# Patient Record
Sex: Male | Born: 1980 | Race: White | Hispanic: No | Marital: Married | Smoking: Former smoker
Health system: Southern US, Community
[De-identification: ages and names within clinical notes are randomized; demographics above are authoritative.]

## PROBLEM LIST (undated history)

## (undated) DIAGNOSIS — R042 Hemoptysis: Secondary | ICD-10-CM

## (undated) DIAGNOSIS — L409 Psoriasis, unspecified: Secondary | ICD-10-CM

## (undated) HISTORY — PX: NO PAST SURGERIES: SHX2092

## (undated) SURGERY — Surgical Case
Anesthesia: *Unknown

---

## 2011-07-26 ENCOUNTER — Emergency Department (HOSPITAL_COMMUNITY): Payer: BC Managed Care – PPO

## 2011-07-26 ENCOUNTER — Encounter (HOSPITAL_COMMUNITY): Admission: EM | Disposition: A | Discharge status: Home / Self Care | Payer: Self-pay | Source: Home / Self Care

## 2011-07-26 ENCOUNTER — Encounter: Payer: Self-pay | Admitting: *Deleted

## 2011-07-26 ENCOUNTER — Ambulatory Visit (HOSPITAL_COMMUNITY)
Admission: EM | Admit: 2011-07-26 | Discharge: 2011-07-26 | Disposition: A | Discharge status: Home / Self Care | Payer: BC Managed Care – PPO | Attending: Gastroenterology | Admitting: Gastroenterology

## 2011-07-26 DIAGNOSIS — K222 Esophageal obstruction: Secondary | ICD-10-CM | POA: Insufficient documentation

## 2011-07-26 DIAGNOSIS — Y92009 Unspecified place in unspecified non-institutional (private) residence as the place of occurrence of the external cause: Secondary | ICD-10-CM | POA: Insufficient documentation

## 2011-07-26 DIAGNOSIS — IMO0002 Reserved for concepts with insufficient information to code with codable children: Secondary | ICD-10-CM

## 2011-07-26 DIAGNOSIS — T18108A Unspecified foreign body in esophagus causing other injury, initial encounter: Secondary | ICD-10-CM

## 2011-07-26 HISTORY — PX: ESOPHAGOGASTRODUODENOSCOPY: SHX5428

## 2011-07-26 HISTORY — DX: Psoriasis, unspecified: L40.9

## 2011-07-26 LAB — CBC
HCT: 47.1 % (ref 39.0–52.0)
Hemoglobin: 16.5 g/dL (ref 13.0–17.0)
MCH: 32.3 pg (ref 26.0–34.0)
MCHC: 35 g/dL (ref 30.0–36.0)
MCV: 92.2 fL (ref 78.0–100.0)
RDW: 12 % (ref 11.5–15.5)

## 2011-07-26 LAB — BASIC METABOLIC PANEL
BUN: 12 mg/dL (ref 6–23)
CO2: 27 mEq/L (ref 19–32)
Calcium: 10 mg/dL (ref 8.4–10.5)
Chloride: 102 mEq/L (ref 96–112)
Creatinine, Ser: 1.02 mg/dL (ref 0.50–1.35)
GFR calc Af Amer: >90 mL/min (ref >90)

## 2011-07-26 LAB — DIFFERENTIAL
Basophils Relative: 1 % (ref 0–1)
Eosinophils Relative: 5 % (ref 0–5)
Monocytes Absolute: 0.5 10*3/uL (ref 0.1–1.0)
Monocytes Relative: 7 % (ref 3–12)
Neutro Abs: 4.2 10*3/uL (ref 1.7–7.7)

## 2011-07-26 SURGERY — EGD (ESOPHAGOGASTRODUODENOSCOPY)
Anesthesia: Moderate Sedation

## 2011-07-26 MED ORDER — GLUCAGON HCL (RDNA) 1 MG IJ SOLR
1.0000 mg | Freq: Once | INTRAMUSCULAR | Status: AC
  Administered 2011-07-26: 1 mg via INTRAVENOUS
  Filled 2011-07-26: qty 1

## 2011-07-26 MED ORDER — OMEPRAZOLE 20 MG PO CPDR: DELAYED_RELEASE_CAPSULE | ORAL | Status: DC

## 2011-07-26 NOTE — H&P (Signed)
  Primary Care Physician:  No primary provider on file. Primary Gastroenterologist:  Dr. Jannet Calip  Pre-Procedure History & Physical: HPI:  Steven Conrad is a 30 y.o. male here for removal of food impaction. Pt ate Venison and it got stuck 730 PM. Wife reports rare heartburn/regurgitation resultign in vomtiing. The patient denies abdominal or flank pain, anorexia, nausea or vomiting, change in bowel habits or black or bloody stools or weight loss. No ASA, BC, GOODY'S. RARE ETOH USE.   History reviewed. No pertinent past medical history.  History reviewed. No pertinent past surgical history.  Prior to Admission medications   Not on File    Allergies as of 07/26/2011  . (No Known Allergies)    FAMILY HX: NO COLON POLYPS/CANCER  History   Social History  . Marital Status: Married    Spouse Name: N/A    Number of Children: N/A  . Years of Education: N/A   Occupational History  . WORKS AT [employer]   Social History Main Topics  . Smoking status: Smoker, Current Status Unknown  . Smokeless tobacco: Not on file  . Alcohol Use: No  . Drug Use:   . Sexually Active:    Other Topics Concern  . Not on file   Social History Narrative  . No narrative on file    Review of Systems: See HPI, otherwise negative ROS   Physical Exam: BP 130/84  Pulse 66  Temp(Src) 97.7 F (36.5 C) (Oral)  Resp 18  Ht 5' 11" (1.803 m)  Wt 245 lb (111.131 kg)  BMI 34.17 kg/m2  SpO2 95% General:   Alert,  pleasant and cooperative in NAD Head:  Normocephalic and atraumatic. Neck:  Supple; no masses or thyromegaly. Lungs:  Clear throughout to auscultation.    Heart:  Regular rate and rhythm. Abdomen:  Soft, nontender and nondistended. Normal bowel sounds, without guarding, and without rebound.   Neurologic:  Alert and  oriented x4;  grossly normal neurologically.  Impression/Plan:   FOOD IMPACTION  PLAN: EGD TONIGHT +/- ESOPHAGEAL BIOPSIES OR DILATION  

## 2011-07-26 NOTE — ED Provider Notes (Signed)
History  Scribed for Benny Lennert, MD, the patient was seen in room APA14/APA14. This chart was scribed by Candelaria Stagers. The patient's care started at  9:04PM.     CSN: [id number] Arrival date & time: 07/26/2011  8:47 PM   First MD Initiated Contact with Patient 07/26/11 2054      Chief Complaint  Patient presents with  . Airway Obstruction     The history is provided by the patient.   Steven Conrad is a 30 y.o. male who presents to the Emergency Department after eating meat one hour ago and feeling like the meat is lodged in his throat.  He has tried drinking water and states that he vomits each time.  He has had similar instances before, but has never come to the ED following any instances.  He has had no SOB.  He has no PCP and currently smokes.      History reviewed. No pertinent past medical history.  History reviewed. No pertinent past surgical history.  No family history on file.  History  Substance Use Topics  . Smoking status: Smoker, Current Status Unknown  . Smokeless tobacco: Not on file  . Alcohol Use: No      Review of Systems  Constitutional: Negative for fever and chills.  HENT: Negative for rhinorrhea and neck pain.   Eyes: Negative for pain.  Respiratory: Negative for cough and shortness of breath.   Cardiovascular: Negative for chest pain.  Gastrointestinal: Positive for vomiting. Negative for nausea, abdominal pain and diarrhea.  Genitourinary: Negative for dysuria.  Musculoskeletal: Negative for back pain.  Skin: Negative for rash.  Neurological: Negative for dizziness and weakness.    Allergies  Review of patient's allergies indicates no known allergies.  Home Medications  No current outpatient prescriptions on file.  BP 152/99  Pulse 88  Temp(Src) 98.3 F (36.8 C) (Oral)  Resp 20  Ht 5\' 11"  (1.803 m)  Wt 245 lb (111.131 kg)  BMI 34.17 kg/m2  SpO2 98%  Physical Exam  Nursing note and vitals reviewed. Constitutional: He  is oriented to person, place, and time. He appears well-developed and well-nourished. No distress.  HENT:  Head: Normocephalic and atraumatic.  Eyes: EOM are normal. Pupils are equal, round, and reactive to light.  Neck: Neck supple. No tracheal deviation present.  Cardiovascular: Normal rate.   Pulmonary/Chest: Effort normal. No respiratory distress.  Abdominal: He exhibits no distension.  Musculoskeletal: Normal range of motion. He exhibits no edema.  Neurological: He is alert and oriented to person, place, and time. No sensory deficit.  Skin: Skin is warm and dry.  Psychiatric: He has a normal mood and affect. His behavior is normal.    ED Course  Procedures   DIAGNOSTIC STUDIES: Oxygen Saturation is 98% on room air, normal by my interpretation.    COORDINATION OF CARE:  9:06 Ordered: CBC ; Differential ; Basic metabolic panel ; glucagon (GLUCAGEN) injection 1 mg ; DG Chest Portable 1 View    Labs Reviewed  BASIC METABOLIC PANEL - Abnormal; Notable for the following:    Glucose, Bld 127 (*)    All other components within normal limits  CBC  DIFFERENTIAL   Dg Chest Portable 1 View  07/26/2011  *RADIOLOGY REPORT*  Clinical Data: Chest pain  PORTABLE CHEST - 1 VIEW  Comparison: None.  Findings: Heart size is upper normal.  Negative for heart failure or pneumonia.  Lungs are clear.  IMPRESSION: No active cardiopulmonary disease.  Original Report  Authenticated By: Camelia Phenes, M.D.     1. Foreign body in esophagus       MDM  Dr. Darrick Penna to see pt   The chart was scribed for me under my direct supervision.  I personally performed the history, physical, and medical decision making and all procedures in the evaluation of this patient.Benny Lennert, MD 07/26/11 2227

## 2011-07-26 NOTE — ED Notes (Signed)
Pt reports he feels food is stuck in back of throat.  Pt states he is unable to swallow food.  States able to swallow small amounts of liquid.  At present time, pt gag reflex is very strong and pt attempting to vomit food up, rather than swallow.

## 2011-07-26 NOTE — ED Notes (Signed)
Pt reports was eating about 45 min ago and feels like food is lodged in throat.  Reports unable to swallow.  No SOB noted at present time. Family reports voice is weaker than normal.

## 2011-07-26 NOTE — ED Notes (Signed)
Pt states that he does still feel like food is stuck in his throat, but reports ease of breathing.  No gagging or drooling noted at present time, and pt appears more calm.

## 2011-07-26 NOTE — ED Notes (Signed)
Pt transported to endoscopy 

## 2011-07-27 ENCOUNTER — Other Ambulatory Visit: Payer: Self-pay | Admitting: Gastroenterology

## 2011-07-27 ENCOUNTER — Telehealth: Payer: Self-pay | Admitting: Gastroenterology

## 2011-07-27 ENCOUNTER — Encounter (HOSPITAL_COMMUNITY): Payer: BC Managed Care – PPO

## 2011-07-27 ENCOUNTER — Encounter (HOSPITAL_COMMUNITY): Payer: Self-pay | Admitting: Pharmacy Technician

## 2011-07-27 ENCOUNTER — Encounter (HOSPITAL_COMMUNITY): Payer: Self-pay

## 2011-07-27 MED ORDER — MIDAZOLAM HCL 5 MG/5ML IJ SOLN
INTRAMUSCULAR | Status: DC | PRN
  Administered 2011-07-26 (×2): 1 mg via INTRAVENOUS
  Administered 2011-07-26 (×2): 2 mg via INTRAVENOUS

## 2011-07-27 MED ORDER — BUTAMBEN-TETRACAINE-BENZOCAINE 2-2-14 % EX AERO
INHALATION_SPRAY | CUTANEOUS | Status: DC | PRN
  Administered 2011-07-26: 2 via TOPICAL

## 2011-07-27 MED ORDER — MEPERIDINE HCL 100 MG/ML IJ SOLN
INTRAMUSCULAR | Status: DC | PRN
  Administered 2011-07-26 (×3): 25 mg via INTRAVENOUS
  Administered 2011-07-26: 50 mg via INTRAVENOUS

## 2011-07-27 NOTE — Telephone Encounter (Signed)
LMOM that Rx has been called in.  

## 2011-07-27 NOTE — OR Nursing (Signed)
Procedure done on 07/26/2011 at 2255. Late entry documentation into Epic due to not being able to get case booked.

## 2011-07-27 NOTE — Telephone Encounter (Signed)
CALLED NEXIUM 40 MG QAM TO [facility] [phone].

## 2011-07-27 NOTE — Telephone Encounter (Signed)
CALL PT TO PICK UP RX

## 2011-07-27 NOTE — Telephone Encounter (Signed)
Kim called from Endo- she was able to get on touch with the pt- they will be doing a phone interview since pt was just seen in the ED last evening

## 2011-07-27 NOTE — Telephone Encounter (Signed)
Message copied by Irish Elders on Wed Jul 27, 2011  9:24 AM ------      Message from: [place]      Created: Wed Jul 27, 2011  8:47 AM       PREOP TODAY 12/5 EGD/DIL TOMORROW 12/6  W/ PROPOFOL DUE TO AGITATION IN SPITE OF HIGH DOSES OF DEMEROL AND VERSED

## 2011-07-27 NOTE — Pre-Procedure Instructions (Signed)
Pharmacy technician has talked with pt about his medications and he reports that he only takes Omeprazole, no other medications. For some reason, this medication is not showing on the home medication list on my screen, but I did inform the pt to take this medication the morning of the procedure 07/28/11.

## 2011-07-27 NOTE — Telephone Encounter (Signed)
Called pt to give him Pre Op time ( today @ 11:15) no answer LMOM  With details and for him to call me back

## 2011-07-28 ENCOUNTER — Ambulatory Visit (HOSPITAL_COMMUNITY): Payer: BC Managed Care – PPO | Admitting: Anesthesiology

## 2011-07-28 ENCOUNTER — Ambulatory Visit (HOSPITAL_COMMUNITY)
Admission: RE | Admit: 2011-07-28 | Discharge: 2011-07-28 | Disposition: A | Discharge status: Home / Self Care | Payer: BC Managed Care – PPO | Source: Ambulatory Visit | Attending: Gastroenterology | Admitting: Gastroenterology

## 2011-07-28 ENCOUNTER — Other Ambulatory Visit: Payer: Self-pay | Admitting: Gastroenterology

## 2011-07-28 ENCOUNTER — Encounter (HOSPITAL_COMMUNITY): Payer: Self-pay | Admitting: Anesthesiology

## 2011-07-28 ENCOUNTER — Encounter (HOSPITAL_COMMUNITY)
Admission: RE | Disposition: A | Discharge status: Home / Self Care | Payer: Self-pay | Source: Ambulatory Visit | Attending: Gastroenterology

## 2011-07-28 ENCOUNTER — Encounter (HOSPITAL_COMMUNITY): Payer: Self-pay | Admitting: *Deleted

## 2011-07-28 DIAGNOSIS — K2990 Gastroduodenitis, unspecified, without bleeding: Secondary | ICD-10-CM

## 2011-07-28 DIAGNOSIS — K222 Esophageal obstruction: Secondary | ICD-10-CM

## 2011-07-28 DIAGNOSIS — K2940 Chronic atrophic gastritis without bleeding: Secondary | ICD-10-CM | POA: Insufficient documentation

## 2011-07-28 DIAGNOSIS — K2970 Gastritis, unspecified, without bleeding: Secondary | ICD-10-CM

## 2011-07-28 HISTORY — PX: SAVORY DILATION: SHX5439

## 2011-07-28 SURGERY — ESOPHAGOGASTRODUODENOSCOPY (EGD) WITH PROPOFOL
Anesthesia: Monitor Anesthesia Care

## 2011-07-28 MED ORDER — CLOBETASOL PROPIONATE 0.05 % EX CREA: TOPICAL_CREAM | Freq: Two times a day (BID) | CUTANEOUS | Status: AC

## 2011-07-28 MED ORDER — MINERAL OIL PO OIL
TOPICAL_OIL | ORAL | Status: AC
  Filled 2011-07-28: qty 30

## 2011-07-28 MED ORDER — BUTAMBEN-TETRACAINE-BENZOCAINE 2-2-14 % EX AERO
1.0000 | INHALATION_SPRAY | Freq: Once | CUTANEOUS | Status: AC
  Administered 2011-07-28: 1 via TOPICAL
  Filled 2011-07-28: qty 56

## 2011-07-28 MED ORDER — ESOMEPRAZOLE MAGNESIUM 40 MG PO CPDR: DELAYED_RELEASE_CAPSULE | ORAL | Status: AC

## 2011-07-28 MED ORDER — ONDANSETRON HCL 4 MG/2ML IJ SOLN
INTRAMUSCULAR | Status: AC
  Filled 2011-07-28: qty 2

## 2011-07-28 MED ORDER — GLYCOPYRROLATE 0.2 MG/ML IJ SOLN
INTRAMUSCULAR | Status: AC
  Filled 2011-07-28: qty 1

## 2011-07-28 MED ORDER — PROPOFOL 10 MG/ML IV EMUL
INTRAVENOUS | Status: DC | PRN
  Administered 2011-07-28: 75 ug/kg/min via INTRAVENOUS

## 2011-07-28 MED ORDER — ONDANSETRON HCL 4 MG/2ML IJ SOLN: 4.0000 mg | Freq: Once | INTRAMUSCULAR | Status: DC | PRN

## 2011-07-28 MED ORDER — MIDAZOLAM HCL 2 MG/2ML IJ SOLN
1.0000 mg | INTRAMUSCULAR | Status: DC | PRN
  Administered 2011-07-28: 2 mg via INTRAVENOUS

## 2011-07-28 MED ORDER — ONDANSETRON HCL 4 MG/2ML IJ SOLN
4.0000 mg | Freq: Once | INTRAMUSCULAR | Status: AC
  Administered 2011-07-28: 4 mg via INTRAVENOUS

## 2011-07-28 MED ORDER — LACTATED RINGERS IV SOLN
INTRAVENOUS | Status: DC
  Administered 2011-07-28: 10:00:00 via INTRAVENOUS

## 2011-07-28 MED ORDER — FENTANYL CITRATE 0.05 MG/ML IJ SOLN
INTRAMUSCULAR | Status: AC
  Filled 2011-07-28: qty 2

## 2011-07-28 MED ORDER — GLYCOPYRROLATE 0.2 MG/ML IJ SOLN
0.2000 mg | Freq: Once | INTRAMUSCULAR | Status: AC | PRN
  Administered 2011-07-28: 0.2 mg via INTRAVENOUS

## 2011-07-28 MED ORDER — STERILE WATER FOR IRRIGATION IR SOLN
Status: DC | PRN
  Administered 2011-07-28: 12:00:00

## 2011-07-28 MED ORDER — FENTANYL CITRATE 0.05 MG/ML IJ SOLN
INTRAMUSCULAR | Status: DC | PRN
  Administered 2011-07-28 (×2): 50 ug via INTRAVENOUS

## 2011-07-28 MED ORDER — FENTANYL CITRATE 0.05 MG/ML IJ SOLN: 25.0000 ug | INTRAMUSCULAR | Status: DC | PRN

## 2011-07-28 MED ORDER — MIDAZOLAM HCL 2 MG/2ML IJ SOLN
INTRAMUSCULAR | Status: AC
  Filled 2011-07-28: qty 2

## 2011-07-28 SURGICAL SUPPLY — 17 items
BLOCK BITE 60FR ADLT L/F BLUE (MISCELLANEOUS) ×2 IMPLANT
ELECT REM PT RETURN 9FT ADLT (ELECTROSURGICAL)
ELECTRODE REM PT RTRN 9FT ADLT (ELECTROSURGICAL) IMPLANT
FLOOR PAD 36X40 (MISCELLANEOUS) ×2
FORCEP RJ3 GP 1.8X160 W-NEEDLE (CUTTING FORCEPS) IMPLANT
FORCEPS BIOP RAD 4 LRG CAP 4 (CUTTING FORCEPS) ×2 IMPLANT
NEEDLE SCLEROTHERAPY 25GX240 (NEEDLE) IMPLANT
PAD FLOOR 36X40 (MISCELLANEOUS) ×1 IMPLANT
PROBE APC STR FIRE (PROBE) IMPLANT
PROBE INJECTION GOLD (MISCELLANEOUS)
PROBE INJECTION GOLD 7FR (MISCELLANEOUS) IMPLANT
SNARE ROTATE MED OVAL 20MM (MISCELLANEOUS) IMPLANT
SNARE SHORT THROW 13M SML OVAL (MISCELLANEOUS) IMPLANT
SYR 50ML LL SCALE MARK (SYRINGE) ×2 IMPLANT
TUBING ENDO SMARTCAP PENTAX (MISCELLANEOUS) ×2 IMPLANT
TUBING IRRIGATION ENDOGATOR (MISCELLANEOUS) ×2 IMPLANT
WATER STERILE IRR 1000ML POUR (IV SOLUTION) ×2 IMPLANT

## 2011-07-28 NOTE — Interval H&P Note (Signed)
History and Physical Interval Note:  07/28/2011 11:20 AM  Steven Conrad  has presented today for surgery, with the diagnosis of food impaction  The various methods of treatment have been discussed with the patient and family. After consideration of risks, benefits and other options for treatment, the patient has consented to  Procedure(s): ESOPHAGOGASTRODUODENOSCOPY (EGD) WITH PROPOFOL SAVORY DILATION MALONEY DILATION as a surgical intervention .  The patients' history has been reviewed, patient examined, no change in status, stable for surgery.  I have reviewed the patients' chart and labs.  Questions were answered to the patient's satisfaction.     [employer]

## 2011-07-28 NOTE — Telephone Encounter (Signed)
Addended by: [place] on: 07/28/2011 12:09 PM   Modules accepted: Orders

## 2011-07-28 NOTE — Telephone Encounter (Signed)
CALLED  IN CLOBETASOL .05% AS DIRECTED #1, [id number].

## 2011-07-28 NOTE — Op Note (Signed)
[facility] [address] [place], [place]  [address]  ENDOSCOPY PROCEDURE REPORT  PATIENT:  Steven Conrad, Steven Conrad  MR#:  [id number] BIRTHDATE:  November 07, 1980, 30 yrs. old  GENDER:  male  ENDOSCOPIST:  Jonette Eva, MD ASSISTANT: Referred by:  DR. Colon Branch  PROCEDURE DATE:  07/28/2011 PROCEDURE:  EGD with dilatation over guidewire, EGD with biopsy ASA CLASS: INDICATIONS:  FOOD IMPACTION 12/4 STRICTURE ON EGD 12/4 UNABLE TO DILATE DUE TO PT AGITATION AND ROSK OF ASPIRATION/STOMACH FULL OF FOOD.  MEDICATIONS:   MAC sedation, administered by CRNA TOPICAL ANESTHETIC:  Cetacaine Spray  DESCRIPTION OF PROCEDURE:   After the risks benefits and alternatives of the procedure were thoroughly explained, informed consent was obtained.  The  endoscope was introduced through the mouth and advanced to the second portion of the duodenum.  The instrument was slowly withdrawn as the mucosa was carefully examined.  Prior to withdrawal of the scope, the guidwire was placed.  The esophagus was dilated successfully.  The patient was recovered in endoscopy and discharged home in satisfactory condition. <<PROCEDUREIMAGES>> Mild gastritis was found & BIOPSIED VIA COLD FORCEPS.  A stricture was found in the distal esophagus NARRPWING THE LUMEN TO 12-13 MM. Dilation was then performed at the distal esophagus. NO BARRETT'S OR FELINE APPEARANCE TO THE ESOPHAGUS. NL DUODENUM.  1) Dilator:  Savary over guidewire  Size(s):  12.8 TO 16 MM Resistance:  moderate  Heme: Appearance:  COMPLICATIONS:  None  ENDOSCOPIC IMPRESSION: 1) Mild gastritis 2) Stricture in the distal esophagus  RECOMMENDATIONS: AWAIT BIOPSIES NEXIUM 30 MINUTES PRIOR TO BREAKFAST DAILY FOREVER OPV IN 3 MOS. NO NEED TO Bx ESOPHAGUS AT THIS TIME.  REPEAT EXAM:  No  ______________________________ Jonette Eva, MD  CC:  n. eSIGNED:   Tequisha Maahs at 07/28/2011 11:56 AM  Bonnee Quin, [phone]

## 2011-07-28 NOTE — Anesthesia Preprocedure Evaluation (Signed)
Anesthesia Evaluation  Patient identified by MRN, date of birth, ID band Patient awake    Reviewed: Allergy & Precautions, H&P , NPO status , Patient's Chart, lab work & pertinent test results  Airway Mallampati: I      Dental  (+) Teeth Intact   Pulmonary Current Smoker,    Pulmonary exam normal       Cardiovascular Regular Normal    Neuro/Psych    GI/Hepatic Hx food impaction   Endo/Other    Renal/GU      Musculoskeletal   Abdominal   Peds  Hematology   Anesthesia Other Findings   Reproductive/Obstetrics                           Anesthesia Physical Anesthesia Plan  ASA: II  Anesthesia Plan: MAC   Post-op Pain Management:    Induction:   Airway Management Planned: Nasal Cannula  Additional Equipment:   Intra-op Plan:   Post-operative Plan:   Informed Consent: I have reviewed the patients History and Physical, chart, labs and discussed the procedure including the risks, benefits and alternatives for the proposed anesthesia with the patient or authorized representative who has indicated his/her understanding and acceptance.     Plan Discussed with:   Anesthesia Plan Comments:         Anesthesia Quick Evaluation

## 2011-07-28 NOTE — Anesthesia Postprocedure Evaluation (Signed)
  Anesthesia Post-op Note  Patient: Steven Conrad  Procedure(s) Performed:  ESOPHAGOGASTRODUODENOSCOPY (EGD) WITH PROPOFOL - 10:30; SAVORY DILATION - Dilated with 12.8, 14, 15, and 16  Patient Location: PACU  Anesthesia Type: MAC  Level of Consciousness: awake, alert  and oriented  Airway and Oxygen Therapy: Patient Spontanous Breathing  Post-op Pain: none  Post-op Assessment: Post-op Vital signs reviewed, Patient's Cardiovascular Status Stable, Respiratory Function Stable and No signs of Nausea or vomiting  Post-op Vital Signs: Reviewed and stable  Complications: No apparent anesthesia complications

## 2011-07-28 NOTE — H&P (View-Only) (Signed)
  Primary Care Physician:  No primary provider on file. Primary Gastroenterologist:  Dr. Darrick Penna  Pre-Procedure History & Physical: HPI:  Steven Conrad is a 30 y.o. male here for removal of food impaction. Pt ate Venison and it got stuck 730 PM. Wife reports rare heartburn/regurgitation resultign in vomtiing. The patient denies abdominal or flank pain, anorexia, nausea or vomiting, change in bowel habits or black or bloody stools or weight loss. No ASA, BC, GOODY'S. RARE ETOH USE.   History reviewed. No pertinent past medical history.  History reviewed. No pertinent past surgical history.  Prior to Admission medications   Not on File    Allergies as of 07/26/2011  . (No Known Allergies)    FAMILY HX: NO COLON POLYPS/CANCER  History   Social History  . Marital Status: Married    Spouse Name: N/A    Number of Children: N/A  . Years of Education: N/A   Occupational History  . WORKS AT [employer]   Social History Main Topics  . Smoking status: Smoker, Current Status Unknown  . Smokeless tobacco: Not on file  . Alcohol Use: No  . Drug Use:   . Sexually Active:    Other Topics Concern  . Not on file   Social History Narrative  . No narrative on file    Review of Systems: See HPI, otherwise negative ROS   Physical Exam: BP 130/84  Pulse 66  Temp(Src) 97.7 F (36.5 C) (Oral)  Resp 18  Ht 5\' 11"  (1.803 m)  Wt 245 lb (111.131 kg)  BMI 34.17 kg/m2  SpO2 95% General:   Alert,  pleasant and cooperative in NAD Head:  Normocephalic and atraumatic. Neck:  Supple; no masses or thyromegaly. Lungs:  Clear throughout to auscultation.    Heart:  Regular rate and rhythm. Abdomen:  Soft, nontender and nondistended. Normal bowel sounds, without guarding, and without rebound.   Neurologic:  Alert and  oriented x4;  grossly normal neurologically.  Impression/Plan:   FOOD IMPACTION  PLAN: EGD TONIGHT +/- ESOPHAGEAL BIOPSIES OR DILATION

## 2011-07-28 NOTE — Transfer of Care (Signed)
Immediate Anesthesia Transfer of Care Note  Patient: Steven Conrad  Procedure(s) Performed:  ESOPHAGOGASTRODUODENOSCOPY (EGD) WITH PROPOFOL - 10:30; SAVORY DILATION - Dilated with 12.8, 14, 15, and 16  Patient Location: PACU  Anesthesia Type: MAC  Level of Consciousness: awake, alert  and oriented  Airway & Oxygen Therapy: Patient Spontanous Breathing  Post-op Assessment: Report given to PACU RN  Post vital signs: Reviewed and stable  Complications: No apparent anesthesia complications

## 2011-07-29 ENCOUNTER — Telehealth: Payer: Self-pay | Admitting: Gastroenterology

## 2011-07-29 NOTE — Telephone Encounter (Signed)
Please call pt. His stomach Bx shows mild gastritis. Continue NEXIUM 30 minutes prior to YOUR FIRST meal DAILY. OPV MAR 2013.

## 2011-08-01 NOTE — Telephone Encounter (Signed)
LMOM to call.

## 2011-08-02 ENCOUNTER — Encounter (HOSPITAL_COMMUNITY): Payer: Self-pay | Admitting: Gastroenterology

## 2011-08-02 NOTE — Telephone Encounter (Signed)
Pt informed

## 2011-08-02 NOTE — Telephone Encounter (Signed)
No PCP on file 

## 2011-08-03 NOTE — Telephone Encounter (Signed)
Reminder in epic to follow up in March 2013

## 2011-08-12 ENCOUNTER — Encounter (HOSPITAL_COMMUNITY): Payer: Self-pay | Admitting: Gastroenterology

## 2011-11-14 ENCOUNTER — Encounter: Payer: Self-pay | Admitting: Gastroenterology

## 2017-09-25 DIAGNOSIS — R042 Hemoptysis: Secondary | ICD-10-CM | POA: Diagnosis not present

## 2017-09-25 DIAGNOSIS — Z79899 Other long term (current) drug therapy: Secondary | ICD-10-CM | POA: Diagnosis not present

## 2017-09-25 DIAGNOSIS — R05 Cough: Secondary | ICD-10-CM | POA: Diagnosis not present

## 2017-09-25 DIAGNOSIS — F17210 Nicotine dependence, cigarettes, uncomplicated: Secondary | ICD-10-CM | POA: Diagnosis not present

## 2017-09-25 DIAGNOSIS — R911 Solitary pulmonary nodule: Secondary | ICD-10-CM | POA: Diagnosis not present

## 2017-09-26 ENCOUNTER — Other Ambulatory Visit: Payer: Self-pay

## 2017-09-26 ENCOUNTER — Emergency Department (HOSPITAL_COMMUNITY): Payer: BLUE CROSS/BLUE SHIELD

## 2017-09-26 ENCOUNTER — Encounter (HOSPITAL_COMMUNITY): Payer: Self-pay | Admitting: Emergency Medicine

## 2017-09-26 ENCOUNTER — Emergency Department (HOSPITAL_COMMUNITY)
Admission: EM | Admit: 2017-09-26 | Discharge: 2017-09-26 | Disposition: A | Discharge status: Home / Self Care | Payer: BLUE CROSS/BLUE SHIELD | Attending: Emergency Medicine | Admitting: Emergency Medicine

## 2017-09-26 ENCOUNTER — Telehealth: Payer: Self-pay

## 2017-09-26 ENCOUNTER — Telehealth: Payer: Self-pay | Admitting: Internal Medicine

## 2017-09-26 DIAGNOSIS — R05 Cough: Secondary | ICD-10-CM | POA: Diagnosis not present

## 2017-09-26 DIAGNOSIS — R042 Hemoptysis: Secondary | ICD-10-CM

## 2017-09-26 DIAGNOSIS — R911 Solitary pulmonary nodule: Secondary | ICD-10-CM

## 2017-09-26 LAB — COMPREHENSIVE METABOLIC PANEL
ALT: 36 U/L (ref 17–63)
ANION GAP: 13 (ref 5–15)
AST: 27 U/L (ref 15–41)
Albumin: 4.5 g/dL (ref 3.5–5.0)
Alkaline Phosphatase: 72 U/L (ref 38–126)
BUN: 13 mg/dL (ref 6–20)
CHLORIDE: 103 mmol/L (ref 101–111)
CO2: 22 mmol/L (ref 22–32)
Calcium: 9.8 mg/dL (ref 8.9–10.3)
Creatinine, Ser: 0.98 mg/dL (ref 0.61–1.24)
GFR calc non Af Amer: >60 mL/min (ref >60)
GLUCOSE: 104 mg/dL — AB (ref 65–99)
Potassium: 4.1 mmol/L (ref 3.5–5.1)
SODIUM: 138 mmol/L (ref 135–145)
Total Bilirubin: 0.6 mg/dL (ref 0.3–1.2)
Total Protein: 7.2 g/dL (ref 6.5–8.1)

## 2017-09-26 LAB — CBC WITH DIFFERENTIAL/PLATELET
Basophils Absolute: 0 10*3/uL (ref 0.0–0.1)
Basophils Relative: 1 %
EOS ABS: 0.2 10*3/uL (ref 0.0–0.7)
EOS PCT: 3 %
HCT: 43.5 % (ref 39.0–52.0)
HEMOGLOBIN: 15 g/dL (ref 13.0–17.0)
LYMPHS ABS: 2.3 10*3/uL (ref 0.7–4.0)
Lymphocytes Relative: 40 %
MCH: 32.8 pg (ref 26.0–34.0)
MCHC: 34.5 g/dL (ref 30.0–36.0)
MCV: 95.2 fL (ref 78.0–100.0)
Monocytes Absolute: 0.7 10*3/uL (ref 0.1–1.0)
Monocytes Relative: 12 %
NEUTROS PCT: 44 %
Neutro Abs: 2.6 10*3/uL (ref 1.7–7.7)
PLATELETS: 177 10*3/uL (ref 150–400)
RBC: 4.57 MIL/uL (ref 4.22–5.81)
RDW: 12.6 % (ref 11.5–15.5)
WBC: 5.8 10*3/uL (ref 4.0–10.5)

## 2017-09-26 NOTE — Telephone Encounter (Signed)
MW agreed to see pt next Friday in 15 minute slot.  Called pt to schedule consult.  Nothing further needed.

## 2017-09-26 NOTE — Telephone Encounter (Signed)
Steven Conrad has been scheduled with Dr. Nicholos Johnsamachandran [place]30 [place]. Steven Conrad is aware of new appt. Nothing else needed at time of call.

## 2017-09-26 NOTE — Progress Notes (Signed)
Imaging personally reviewed, CT chest 09/26/17, there is a 2.5 cm nodule in the right middle lobe, no significant lymphadenopathy, no other abnormalities are noted.

## 2017-09-26 NOTE — ED Provider Notes (Signed)
[facility] EMERGENCY DEPARTMENT Provider Note   CSN: [id number] Arrival date & time: 09/25/17  2354     History   Chief Complaint Chief Complaint  Patient presents with  . Hemoptysis    HPI Steven Conrad is a 37 y.o. male with history of psoriasis and active cigarette smoker who presents following a few episodes of hemoptysis that occurred last night.  Patient reports that he felt like he had some phlegm in the back of his throat and coughed up 3-4 times large amounts of blood with clots.  The pictures the patient showed me showed about dime to quarter size clots with significantly blood-tinged sputum.  Patient reports he usually has a few cigarettes a day now, however used to smoke more.  He reports he occasionally has a spot of blood when he coughs over the past year, however but nothing like this.  He denies any chest pain or shortness of breath.  He denies any coughing, fever, nasal congestion, or other signs of respiratory infection.  He states he was feeling fine prior to the episodes of hemoptysis.  He did not do anything at home prior to arrival.  He denies any fever, night sweats, weight changes.  HPI  Past Medical History:  Diagnosis Date  . Psoriasis     There are no active problems to display for this patient.   Past Surgical History:  Procedure Laterality Date  . ESOPHAGOGASTRODUODENOSCOPY  07/26/2011   Procedure: ESOPHAGOGASTRODUODENOSCOPY (EGD);  Surgeon: Arlyce Harman, MD;  Location: [facility] SUITE;  Service: Endoscopy;  Laterality: N/A;  . NO PAST SURGERIES    . SAVORY DILATION  07/28/2011   Procedure: SAVORY DILATION;  Surgeon: Arlyce Harman, MD;  Location: [facility] ORS;  Service: Endoscopy;  Laterality: N/A;  Dilated with 12.8, 14, 15, and 16       Home Medications    Prior to Admission medications   Medication Sig Start Date End Date Taking? Authorizing Provider  esomeprazole (NEXIUM) 40 MG capsule 1 po 30 minutes prior to breakfast  07/28/11   West Bali, MD    Family History Family History  Problem Relation Age of Onset  . Anesthesia problems Neg Hx   . Hypotension Neg Hx   . Malignant hyperthermia Neg Hx   . Pseudochol deficiency Neg Hx     Social History Social History   Tobacco Use  . Smoking status: Current Every Day Smoker    Packs/day: 0.50    Years: 18.00    Pack years: 9.00    Types: Cigarettes  . Smokeless tobacco: Never Used  Substance Use Topics  . Alcohol use: Yes    Comment: 1/5 liquor on weekends  . Drug use: No     Allergies   Patient has no known allergies.   Review of Systems Review of Systems  Constitutional: Negative for chills and fever.  HENT: Negative for facial swelling and sore throat.   Respiratory: Positive for cough (hemoptysis). Negative for shortness of breath.   Cardiovascular: Negative for chest pain.  Gastrointestinal: Negative for abdominal pain, nausea and vomiting.  Genitourinary: Negative for dysuria.  Musculoskeletal: Negative for back pain.  Skin: Negative for rash and wound.  Neurological: Negative for headaches.  Psychiatric/Behavioral: The patient is not nervous/anxious.      Physical Exam Updated Vital Signs BP 118/75 (BP Location: Right Arm)   Pulse (!) 51   Temp 97.8 F (36.6 C) (Oral)   Resp 16   Ht  5\' 10"  (1.778 m)   Wt 99.8 kg (220 lb)   SpO2 99%   BMI 31.57 kg/m   Physical Exam  Constitutional: He appears well-developed and well-nourished. No distress.  HENT:  Head: Normocephalic and atraumatic.  Mouth/Throat: Oropharynx is clear and moist. No oropharyngeal exudate.  Eyes: Conjunctivae are normal. Pupils are equal, round, and reactive to light. Right eye exhibits no discharge. Left eye exhibits no discharge. No scleral icterus.  Neck: Normal range of motion. Neck supple. No thyromegaly present.  Cardiovascular: Normal rate, regular rhythm, normal heart sounds and intact distal pulses. Exam reveals no gallop and no friction  rub.  No murmur heard. Pulmonary/Chest: Effort normal and breath sounds normal. No stridor. No respiratory distress. He has no wheezes. He has no rales.  Abdominal: Soft. Bowel sounds are normal. He exhibits no distension. There is no tenderness. There is no rebound and no guarding.  Musculoskeletal: He exhibits no edema.  Lymphadenopathy:    He has no cervical adenopathy.  Neurological: He is alert. Coordination normal.  Skin: Skin is warm and dry. No rash noted. He is not diaphoretic. No pallor.  Psychiatric: He has a normal mood and affect.  Nursing note and vitals reviewed.    ED Treatments / Results  Labs (all labs ordered are listed, but only abnormal results are displayed) Labs Reviewed  COMPREHENSIVE METABOLIC PANEL - Abnormal; Notable for the following components:      Result Value   Glucose, Bld 104 (*)    All other components within normal limits  CBC WITH DIFFERENTIAL/PLATELET    EKG  EKG Interpretation None       Radiology Dg Chest 2 View  Result Date: 09/26/2017 CLINICAL DATA:  Productive cough EXAM: CHEST  2 VIEW COMPARISON:  07/26/2011 FINDINGS: Small focal opacity in the right mid lung. No pleural effusion. Normal heart size. No pneumothorax. IMPRESSION: Small focal opacity in the right mid lung, could represent a small focus of inflammation or infiltrate. Electronically Signed   By: Jasmine Pang M.D.   On: 09/26/2017 00:39   Ct Chest Wo Contrast  Result Date: 09/26/2017 CLINICAL DATA:  Hemoptysis.  Abnormal chest x-ray. EXAM: CT CHEST WITHOUT CONTRAST TECHNIQUE: Multidetector CT imaging of the chest was performed following the standard protocol without IV contrast. COMPARISON:  Two-view chest x-ray from the same day. FINDINGS: Cardiovascular: Heart size is normal. Aorta and great vessels are within normal limits. Pulmonary artery size is unremarkable. No significant pericardial effusion is present. Mediastinum/Nodes: No significant mediastinal, hilar, or  axillary adenopathy is present. Lungs/Pleura: The pleural based spiculated nodular density is present along the minor fissure in the right middle lobe. The lesion measures 2.5 x 2.3 x 1.8 cm. There is focal cavitation anteriorly. No other focal nodule or airspace disease is present. There is no pleural effusion or pneumothorax. Upper Abdomen: Within normal limits. Musculoskeletal: Vertebral body heights and alignment are normal. IMPRESSION: 1. Spiculated pulmonary nodule in the right middle lobe measures 2.5 x 2.3 x 1.8 cm. Although the patient is only 37 years old, with a significant smoking history, this is most concerning for a primary lung neoplasm. Cavitary infection is considered less likely. Recommend PET scan for further evaluation of malignancy. These results were called by telephone at the time of interpretation on 09/26/2017 at 7:54 am to [facility] Ivelise Castillo, PA, who verbally acknowledged these results. Electronically Signed   By: Marin Roberts M.D.   On: 09/26/2017 07:57    Procedures Procedures (including critical care time)  Medications Ordered in ED Medications - No data to display   Initial Impression / Assessment and Plan / ED Course  I have reviewed the triage vital signs and the nursing notes.  Pertinent labs & imaging results that were available during my care of the patient were reviewed by me and considered in my medical decision making (see chart for details).     Patient with spiculated pulmonary nodule in the right middle lobe found on CT today in the setting of hemoptysis.  Radiologist states considering significant smoking history, most concerning for primary lung neoplasm and recommend PET scan for further evaluation.  Lung nodule measures 2.5 x 2.3 x 1.8 cm.  Chest x-ray showed small focal opacity in the right mid lung, could represent a small focus of inflammation or infiltrate.  Considering no signs of infection, large amount of hemoptysis, and smoking history, CT was  ordered.  Patient advised of these findings and that he needs to follow-up with pulmonary for further evaluation, including PET scan.  He understands the seriousness and urgency of this finding.  Smoking cessation discussed several times.  He and his wife understand and agree with plan.  Patient vitals stable and discharged in satisfactory condition. I discussed patient case with Dr. Preston FleetingGlick who guided the patient's management and agrees with plan.   Final Clinical Impressions(s) / ED Diagnoses   Final diagnoses:  Hemoptysis  Pulmonary nodule, right    ED Discharge Orders        Ordered    Ambulatory referral to Pulmonology    Comments:  Spiculated lung nodule found on chest CT needing further workup   09/26/17 0800       Emi HolesLaw, Danel Requena M, PA-C 09/26/17 [id number]    Dione BoozeGlick, David, MD 10/02/17 2250

## 2017-09-26 NOTE — Discharge Instructions (Signed)
We found a pulmonary nodule on your lung CT today very concerning for lung cancer.  It is important that you follow-up with the pulmonary doctor as soon as possible for further evaluation and treatment. A PET scan was recommended by the radiologist that read your CT scan, which will be arranged by the lung doctor. I recommend that you quit smoking as soon as possible.  You can call the number included on this paperwork to help with smoking cessation.  Please return to the emergency department if you develop any new or worsening symptoms.  It was a pleasure taking care of you today.

## 2017-09-26 NOTE — ED Triage Notes (Signed)
Pt st's he felt like he had phlegm in his throat tonight and when he coughed it up it was blood.  St's then it happened 2-3 more times.  Pt denies any other symptoms and denies recently being sick.

## 2017-09-27 ENCOUNTER — Encounter: Payer: Self-pay | Admitting: Internal Medicine

## 2017-09-27 ENCOUNTER — Ambulatory Visit: Payer: BLUE CROSS/BLUE SHIELD

## 2017-09-27 ENCOUNTER — Encounter
Admission: RE | Admit: 2017-09-27 | Discharge: 2017-09-27 | Disposition: A | Discharge status: Home / Self Care | Payer: PRIVATE HEALTH INSURANCE | Source: Ambulatory Visit | Attending: Internal Medicine | Admitting: Internal Medicine

## 2017-09-27 ENCOUNTER — Ambulatory Visit (INDEPENDENT_AMBULATORY_CARE_PROVIDER_SITE_OTHER): Payer: BLUE CROSS/BLUE SHIELD | Admitting: Internal Medicine

## 2017-09-27 ENCOUNTER — Telehealth: Payer: Self-pay | Admitting: Internal Medicine

## 2017-09-27 ENCOUNTER — Other Ambulatory Visit: Payer: Self-pay

## 2017-09-27 VITALS — BP 156/92 | HR 71 | Resp 16 | Ht 68.0 in | Wt 232.0 lb

## 2017-09-27 DIAGNOSIS — R918 Other nonspecific abnormal finding of lung field: Secondary | ICD-10-CM

## 2017-09-27 HISTORY — DX: Hemoptysis: R04.2

## 2017-09-27 NOTE — H&P (View-Only) (Signed)
Name: Steven Conrad MRN: [id number] DOB: 10-20-1980     CONSULTATION DATE: 2.6.19 REFERRING MD : [facility]  CHIEF COMPLAINT:  hemoptysis     HISTORY OF PRESENT ILLNESS:    37 yo pleasant white male seen today for acute hemoptysis Patient was at home when he developed acute hemoptysis 2 days ago It has stooped since then, no previous history of hemoptysis Denies fevers, chills, NVD. Patient denies CP, SOB,DOE He states that he had pneumonia approx 6 months had CXR in [place]-imaging not available at this time  I reviewed CT chest with patient/wife CT chest images shows to patient  Patient has been smoking since age 62   PAST MEDICAL HISTORY :   has a past medical history of Psoriasis.  has a past surgical history that includes No past surgeries; Savory dilation (07/28/2011); and Esophagogastroduodenoscopy (07/26/2011). Prior to Admission medications   Medication Sig Start Date End Date Taking? Authorizing Provider  esomeprazole (NEXIUM) 40 MG capsule 1 po 30 minutes prior to breakfast Patient not taking: Reported on 09/27/2017 07/28/11   West Bali, MD   No Known Allergies  FAMILY HISTORY:  family history includes Sleep apnea in his mother; Thyroid cancer in his mother; Thyroid disease in his mother. SOCIAL HISTORY:  reports that he has been smoking cigarettes.  He has a 9.00 pack-year smoking history. he has never used smokeless tobacco. He reports that he drinks alcohol. He reports that he does not use drugs.  REVIEW OF SYSTEMS:   Constitutional: Negative for fever, chills, weight loss, malaise/fatigue and diaphoresis.  HENT: Negative for hearing loss, ear pain, nosebleeds, congestion, sore throat, neck pain, tinnitus and ear discharge.   Eyes: Negative for blurred vision, double vision, photophobia, pain, discharge and redness.  Respiratory: +cough, +hemoptysis,- sputum production, -shortness of breath, -wheezing and- stridor.   Cardiovascular: Negative for chest  pain, palpitations, orthopnea, claudication, leg swelling and PND.  Gastrointestinal: Negative for heartburn, nausea, vomiting, abdominal pain, diarrhea, constipation, blood in stool and melena.  Genitourinary: Negative for dysuria, urgency, frequency, hematuria and flank pain.  Musculoskeletal: Negative for myalgias, back pain, joint pain and falls.  Skin: Negative for itching and rash.  Neurological: Negative for dizziness, tingling, tremors, sensory change, speech change, focal weakness, seizures, loss of consciousness, weakness and headaches.  Endo/Heme/Allergies: Negative for environmental allergies and polydipsia. Does not bruise/bleed easily.  ALL OTHER ROS ARE NEGATIVE   BP (!) 156/92 (BP Location: Left Arm, Cuff Size: Large)   Pulse 71   Resp 16   Ht 5\' 8"  (1.727 m)   Wt 232 lb (105.2 kg)   SpO2 99%   BMI 35.28 kg/m   Physical Examination:   GENERAL:NAD, no fevers, chills, no weakness no fatigue HEAD: Normocephalic, atraumatic.  EYES: Pupils equal, round, reactive to light. Extraocular muscles intact. No scleral icterus.  MOUTH: Moist mucosal membrane.   EAR, NOSE, THROAT: Clear without exudates. No external lesions.  NECK: Supple. No thyromegaly. No nodules. No JVD.  PULMONARY:CTA B/L no wheezes, no crackles, no rhonchi CARDIOVASCULAR: S1 and S2. Regular rate and rhythm. No murmurs, rubs, or gallops. No edema.  GASTROINTESTINAL: Soft, nontender, nondistended. No masses. Positive bowel sounds.  MUSCULOSKELETAL: No swelling, clubbing, or edema. Range of motion full in all extremities.  NEUROLOGIC: Cranial nerves II through XII are intact. No gross focal neurological deficits.  SKIN: No ulceration, lesions, rashes, or cyanosis. Skin warm and dry. Turgor intact.  PSYCHIATRIC: Mood, affect within normal limits. The patient is awake, alert and  oriented x 3. Insight, judgment intact.      Recent Labs  Lab 09/26/17 0015  NA 138  K 4.1  CL 103  CO2 22  BUN 13    CREATININE 0.98  GLUCOSE 104*   Recent Labs  Lab 09/26/17 0015  HGB 15.0  HCT 43.5  WBC 5.8  PLT 177   Dg Chest 2 View  Result Date: 09/26/2017 CLINICAL DATA:  Productive cough EXAM: CHEST  2 VIEW COMPARISON:  07/26/2011 FINDINGS: Small focal opacity in the right mid lung. No pleural effusion. Normal heart size. No pneumothorax. IMPRESSION: Small focal opacity in the right mid lung, could represent a small focus of inflammation or infiltrate. Electronically Signed   By: Jasmine PangKim  Fujinaga M.D.   On: 09/26/2017 00:39   Ct Chest Wo Contrast  Result Date: 09/26/2017 CLINICAL DATA:  Hemoptysis.  Abnormal chest x-ray. EXAM: CT CHEST WITHOUT CONTRAST TECHNIQUE: Multidetector CT imaging of the chest was performed following the standard protocol without IV contrast. COMPARISON:  Two-view chest x-ray from the same day. FINDINGS: Cardiovascular: Heart size is normal. Aorta and great vessels are within normal limits. Pulmonary artery size is unremarkable. No significant pericardial effusion is present. Mediastinum/Nodes: No significant mediastinal, hilar, or axillary adenopathy is present. Lungs/Pleura: The pleural based spiculated nodular density is present along the minor fissure in the right middle lobe. The lesion measures 2.5 x 2.3 x 1.8 cm. There is focal cavitation anteriorly. No other focal nodule or airspace disease is present. There is no pleural effusion or pneumothorax. Upper Abdomen: Within normal limits. Musculoskeletal: Vertebral body heights and alignment are normal. IMPRESSION: 1. Spiculated pulmonary nodule in the right middle lobe measures 2.5 x 2.3 x 1.8 cm. Although the patient is only 37 years old, with a significant smoking history, this is most concerning for a primary lung neoplasm. Cavitary infection is considered less likely. Recommend PET scan for further evaluation of malignancy. These results were called by telephone at the time of interpretation on 09/26/2017 at 7:54 am to [facility]  LAW, PA, who verbally acknowledged these results. Electronically Signed   By: Marin Robertshristopher  Mattern M.D.   On: 09/26/2017 07:57  I have Independently reviewed images of  CT chest   on 09/27/2017 Interpretation: 2.5 x 2 x 1.8 CM mass seen in RT lung    ASSESSMENT / PLAN: 37 yo white male with hemoptysis with RT lung mass in setting of long standing smoking history Most likely diagnosis is primary lung cancer  Patient will need ENB ASAP Plan for pre-op assessment today   The Risks and Benefits of the Bronchoscopy procedure with ENB were explained to patient/family.  I have discussed the risk for Acute Bleeding, increased chance of Infection, increased chance of Respiratory Failure and Cardiac Arrest, Stroke and Death.  I have also explained to avoid all types of NSAIDs  prior to procedure date  to decrease chance of bleeding, and also to avoid food and drinks the midnight prior to procedure.  The procedure consists of a video camera with a light source to be placed and inserted  into the lungs to  look for abnormal tissue and to obtain tissue samples by using needle and biopsy tools.  The patient/family understand the risks and benefits and have agreed to proceed with procedure.    Patient/Family are satisfied with Plan of action and management. All questions answered  Lucie LeatherKurian David Toshiro Hanken, M.D.  [facility]  Medical Director [facility] Medical Director [facility]

## 2017-09-27 NOTE — Telephone Encounter (Signed)
Morrie Sheldonshley from pre-admit called to say they may not be able to see patient this afternoon They will call patient after lunch to discuss

## 2017-09-27 NOTE — Progress Notes (Signed)
 Name: Steven Conrad MRN: [id number] DOB: 04/14/1981     CONSULTATION DATE: 2.6.19 REFERRING MD : [facility]  CHIEF COMPLAINT:  hemoptysis     HISTORY OF PRESENT ILLNESS:    36 yo pleasant white male seen today for acute hemoptysis Patient was at home when he developed acute hemoptysis 2 days ago It has stooped since then, no previous history of hemoptysis Denies fevers, chills, NVD. Patient denies CP, SOB,DOE He states that he had pneumonia approx 6 months had CXR in [place]-imaging not available at this time  I reviewed CT chest with patient/wife CT chest images shows to patient  Patient has been smoking since age 9   PAST MEDICAL HISTORY :   has a past medical history of Psoriasis.  has a past surgical history that includes No past surgeries; Savory dilation (07/28/2011); and Esophagogastroduodenoscopy (07/26/2011). Prior to Admission medications   Medication Sig Start Date End Date Taking? Authorizing Provider  esomeprazole (NEXIUM) 40 MG capsule 1 po 30 minutes prior to breakfast Patient not taking: Reported on 09/27/2017 07/28/11   Fields, Sandi L, MD   No Known Allergies  FAMILY HISTORY:  family history includes Sleep apnea in his mother; Thyroid cancer in his mother; Thyroid disease in his mother. SOCIAL HISTORY:  reports that he has been smoking cigarettes.  He has a 9.00 pack-year smoking history. he has never used smokeless tobacco. He reports that he drinks alcohol. He reports that he does not use drugs.  REVIEW OF SYSTEMS:   Constitutional: Negative for fever, chills, weight loss, malaise/fatigue and diaphoresis.  HENT: Negative for hearing loss, ear pain, nosebleeds, congestion, sore throat, neck pain, tinnitus and ear discharge.   Eyes: Negative for blurred vision, double vision, photophobia, pain, discharge and redness.  Respiratory: +cough, +hemoptysis,- sputum production, -shortness of breath, -wheezing and- stridor.   Cardiovascular: Negative for chest  pain, palpitations, orthopnea, claudication, leg swelling and PND.  Gastrointestinal: Negative for heartburn, nausea, vomiting, abdominal pain, diarrhea, constipation, blood in stool and melena.  Genitourinary: Negative for dysuria, urgency, frequency, hematuria and flank pain.  Musculoskeletal: Negative for myalgias, back pain, joint pain and falls.  Skin: Negative for itching and rash.  Neurological: Negative for dizziness, tingling, tremors, sensory change, speech change, focal weakness, seizures, loss of consciousness, weakness and headaches.  Endo/Heme/Allergies: Negative for environmental allergies and polydipsia. Does not bruise/bleed easily.  ALL OTHER ROS ARE NEGATIVE   BP (!) 156/92 (BP Location: Left Arm, Cuff Size: Large)   Pulse 71   Resp 16   Ht 5' 8" (1.727 m)   Wt 232 lb (105.2 kg)   SpO2 99%   BMI 35.28 kg/m   Physical Examination:   GENERAL:NAD, no fevers, chills, no weakness no fatigue HEAD: Normocephalic, atraumatic.  EYES: Pupils equal, round, reactive to light. Extraocular muscles intact. No scleral icterus.  MOUTH: Moist mucosal membrane.   EAR, NOSE, THROAT: Clear without exudates. No external lesions.  NECK: Supple. No thyromegaly. No nodules. No JVD.  PULMONARY:CTA B/L no wheezes, no crackles, no rhonchi CARDIOVASCULAR: S1 and S2. Regular rate and rhythm. No murmurs, rubs, or gallops. No edema.  GASTROINTESTINAL: Soft, nontender, nondistended. No masses. Positive bowel sounds.  MUSCULOSKELETAL: No swelling, clubbing, or edema. Range of motion full in all extremities.  NEUROLOGIC: Cranial nerves II through XII are intact. No gross focal neurological deficits.  SKIN: No ulceration, lesions, rashes, or cyanosis. Skin warm and dry. Turgor intact.  PSYCHIATRIC: Mood, affect within normal limits. The patient is awake, alert and   oriented x 3. Insight, judgment intact.      Recent Labs  Lab 09/26/17 0015  NA 138  K 4.1  CL 103  CO2 22  BUN 13    CREATININE 0.98  GLUCOSE 104*   Recent Labs  Lab 09/26/17 0015  HGB 15.0  HCT 43.5  WBC 5.8  PLT 177   Dg Chest 2 View  Result Date: 09/26/2017 CLINICAL DATA:  Productive cough EXAM: CHEST  2 VIEW COMPARISON:  07/26/2011 FINDINGS: Small focal opacity in the right mid lung. No pleural effusion. Normal heart size. No pneumothorax. IMPRESSION: Small focal opacity in the right mid lung, could represent a small focus of inflammation or infiltrate. Electronically Signed   By: Kim  Fujinaga M.D.   On: 09/26/2017 00:39   Ct Chest Wo Contrast  Result Date: 09/26/2017 CLINICAL DATA:  Hemoptysis.  Abnormal chest x-ray. EXAM: CT CHEST WITHOUT CONTRAST TECHNIQUE: Multidetector CT imaging of the chest was performed following the standard protocol without IV contrast. COMPARISON:  Two-view chest x-ray from the same day. FINDINGS: Cardiovascular: Heart size is normal. Aorta and great vessels are within normal limits. Pulmonary artery size is unremarkable. No significant pericardial effusion is present. Mediastinum/Nodes: No significant mediastinal, hilar, or axillary adenopathy is present. Lungs/Pleura: The pleural based spiculated nodular density is present along the minor fissure in the right middle lobe. The lesion measures 2.5 x 2.3 x 1.8 cm. There is focal cavitation anteriorly. No other focal nodule or airspace disease is present. There is no pleural effusion or pneumothorax. Upper Abdomen: Within normal limits. Musculoskeletal: Vertebral body heights and alignment are normal. IMPRESSION: 1. Spiculated pulmonary nodule in the right middle lobe measures 2.5 x 2.3 x 1.8 cm. Although the patient is only 36 years old, with a significant smoking history, this is most concerning for a primary lung neoplasm. Cavitary infection is considered less likely. Recommend PET scan for further evaluation of malignancy. These results were called by telephone at the time of interpretation on 09/26/2017 at 7:54 am to ALEXANDRA  LAW, PA, who verbally acknowledged these results. Electronically Signed   By: Christopher  Mattern M.D.   On: 09/26/2017 07:57  I have Independently reviewed images of  CT chest   on 09/27/2017 Interpretation: 2.5 x 2 x 1.8 CM mass seen in RT lung    ASSESSMENT / PLAN: 36 yo white male with hemoptysis with RT lung mass in setting of long standing smoking history Most likely diagnosis is primary lung cancer  Patient will need ENB ASAP Plan for pre-op assessment today   The Risks and Benefits of the Bronchoscopy procedure with ENB were explained to patient/family.  I have discussed the risk for Acute Bleeding, increased chance of Infection, increased chance of Respiratory Failure and Cardiac Arrest, Stroke and Death.  I have also explained to avoid all types of NSAIDs  prior to procedure date  to decrease chance of bleeding, and also to avoid food and drinks the midnight prior to procedure.  The procedure consists of a video camera with a light source to be placed and inserted  into the lungs to  look for abnormal tissue and to obtain tissue samples by using needle and biopsy tools.  The patient/family understand the risks and benefits and have agreed to proceed with procedure.    Patient/Family are satisfied with Plan of action and management. All questions answered  Marquavious Nazar David Brynlei Klausner, M.D.  [facility]  Medical Director [facility] Medical Director [facility] Cardio-Pulmonary Department     

## 2017-09-27 NOTE — Patient Instructions (Addendum)
Your procedure is scheduled on:  09/28/17 @ 12:15 pm Report to Same Day Surgery 2nd floor medical mall [facility](Medical Mall Entrance-take elevator on left to 2nd floor.  Check in with surgery information desk.)   Remember: Instructions that are not followed completely may result in serious medical risk, up to and including death, or upon the discretion of your surgeon and anesthesiologist your surgery may need to be rescheduled.    _x___ 1. Do not eat food after midnight the night before your procedure. You may drink clear liquids up to 2 hours before you are scheduled to arrive at the hospital for your procedure.  Do not drink clear liquids within 2 hours of your scheduled arrival to the hospital.  Clear liquids include  --Water or Apple juice without pulp  --Clear carbohydrate beverage such as ClearFast or Gatorade  --Black Coffee or Clear Tea (No milk, no creamers, do not add anything to                  the coffee or Tea Type 1 and type 2 diabetics should only drink water.  No gum chewing or hard candies.     __x__ 2. No Alcohol for 24 hours before or after surgery.   __x__3. No Smoking or e-cigarettes for 24 prior to surgery.  Do not use any chewable tobacco products for at least 6 hour prior to surgery   ____  4. Bring all medications with you on the day of surgery if instructed.    __x__ 5. Notify your doctor if there is any change in your medical condition     (cold, fever, infections).    x___6. On the morning of surgery brush your teeth with toothpaste and water.  You may rinse your mouth with mouth wash if you wish.  Do not swallow any toothpaste or mouthwash.   Do not wear jewelry, make-up, hairpins, clips or nail polish.  Do not wear lotions, powders, or perfumes. You may wear deodorant.  Do not shave 48 hours prior to surgery. Men may shave face and neck.  Do not bring valuables to the hospital.    [facility] not [facility] for any belongings or valuables.    Contacts, dentures or bridgework may not be worn into surgery.  Leave your suitcase in the car. After surgery it may be brought to your room.  For patients admitted to the hospital, discharge time is determined by your                       treatment team.  _  Patients discharged the day of surgery will not be allowed to drive home.  You will need someone to drive you home and stay with you the night of your procedure.    Please read over the following fact sheets that you were given:   [facility] Preparing [facility] and or MRSA Information   _x___ Take anti-hypertensive listed below, cardiac, seizure, asthma,     anti-reflux and psychiatric medicines. These include:  1. None  2.  3.  4.  5.  6.  ____Fleets enema or Magnesium Citrate as directed.   ____ Use CHG Soap or sage wipes as directed on instruction sheet   ____ Use inhalers on the day of surgery and bring to hospital day of surgery  ____ Stop Metformin and Janumet 2 days prior to surgery.    ____ Take 1/2 of usual insulin dose the night before surgery and  none on the morning     surgery.   _x___ Follow recommendations from Cardiologist, Pulmonologist or PCP regarding          stopping Aspirin, Coumadin, Plavix ,Eliquis, Effient, or Pradaxa, and Pletal.  X____Stop Anti-inflammatories such as Advil, Aleve, Ibuprofen, Motrin, Naproxen, Naprosyn, Goodies powders or aspirin products. OK to take Tylenol and                          Celebrex.   _x___ Stop supplements until after surgery.  But may continue Vitamin D, Vitamin B,       and multivitamin.   ____ Bring C-Pap to the hospital.

## 2017-09-27 NOTE — Patient Instructions (Addendum)
  The Risks and Benefits of the Bronchoscopy procedure with bronchoscopy were explained to patient/family.  I have discussed the risk for Acute Bleeding, increased chance of Infection, increased chance of Respiratory Failure and Cardiac Arrest, Stroke and Death.  I have also explained to avoid all types of NSAIDs 1 week prior to procedure date  to decrease chance of bleeding, and also to avoid food and drinks the midnight prior to procedure.  The procedure consists of a video camera with a light source to be placed and inserted  into the lungs to  look for abnormal tissue and to obtain tissue samples by using needle and biopsy tools.  The patient/family understand the risks and benefits and have agreed to proceed with procedure.   Patient needs PRE-OP ASSESSMENT PLAN FOR ENB ASAP

## 2017-09-28 ENCOUNTER — Ambulatory Visit: Payer: BLUE CROSS/BLUE SHIELD | Admitting: Anesthesiology

## 2017-09-28 ENCOUNTER — Encounter: Payer: Self-pay | Admitting: Emergency Medicine

## 2017-09-28 ENCOUNTER — Other Ambulatory Visit: Payer: Self-pay | Admitting: *Deleted

## 2017-09-28 ENCOUNTER — Ambulatory Visit: Payer: BLUE CROSS/BLUE SHIELD

## 2017-09-28 ENCOUNTER — Ambulatory Visit
Admission: RE | Admit: 2017-09-28 | Discharge: 2017-09-28 | Disposition: A | Discharge status: Home / Self Care | Payer: BLUE CROSS/BLUE SHIELD | Source: Ambulatory Visit | Attending: Internal Medicine | Admitting: Internal Medicine

## 2017-09-28 ENCOUNTER — Encounter
Admission: RE | Disposition: A | Discharge status: Home / Self Care | Payer: Self-pay | Source: Ambulatory Visit | Attending: Internal Medicine

## 2017-09-28 DIAGNOSIS — Z808 Family history of malignant neoplasm of other organs or systems: Secondary | ICD-10-CM | POA: Diagnosis not present

## 2017-09-28 DIAGNOSIS — F17210 Nicotine dependence, cigarettes, uncomplicated: Secondary | ICD-10-CM | POA: Diagnosis not present

## 2017-09-28 DIAGNOSIS — L409 Psoriasis, unspecified: Secondary | ICD-10-CM | POA: Insufficient documentation

## 2017-09-28 DIAGNOSIS — Z79899 Other long term (current) drug therapy: Secondary | ICD-10-CM | POA: Diagnosis not present

## 2017-09-28 DIAGNOSIS — R9389 Abnormal findings on diagnostic imaging of other specified body structures: Secondary | ICD-10-CM | POA: Diagnosis not present

## 2017-09-28 DIAGNOSIS — R042 Hemoptysis: Secondary | ICD-10-CM | POA: Insufficient documentation

## 2017-09-28 DIAGNOSIS — R918 Other nonspecific abnormal finding of lung field: Secondary | ICD-10-CM

## 2017-09-28 DIAGNOSIS — J984 Other disorders of lung: Secondary | ICD-10-CM | POA: Diagnosis not present

## 2017-09-28 HISTORY — PX: ENDOBRONCHIAL ULTRASOUND: SHX5096

## 2017-09-28 SURGERY — ENDOBRONCHIAL ULTRASOUND (EBUS)
Anesthesia: General

## 2017-09-28 MED ORDER — MIDAZOLAM HCL 2 MG/2ML IJ SOLN
INTRAMUSCULAR | Status: AC
  Filled 2017-09-28: qty 2

## 2017-09-28 MED ORDER — LIDOCAINE HCL (CARDIAC) 20 MG/ML IV SOLN
INTRAVENOUS | Status: DC | PRN
  Administered 2017-09-28: 60 mg via INTRAVENOUS

## 2017-09-28 MED ORDER — PROMETHAZINE HCL 25 MG/ML IJ SOLN: 6.2500 mg | INTRAMUSCULAR | Status: DC | PRN

## 2017-09-28 MED ORDER — SUCCINYLCHOLINE CHLORIDE 20 MG/ML IJ SOLN
INTRAMUSCULAR | Status: DC | PRN
  Administered 2017-09-28: 100 mg via INTRAVENOUS

## 2017-09-28 MED ORDER — FENTANYL CITRATE (PF) 100 MCG/2ML IJ SOLN
INTRAMUSCULAR | Status: AC
  Filled 2017-09-28: qty 2

## 2017-09-28 MED ORDER — FAMOTIDINE 20 MG PO TABS
ORAL_TABLET | ORAL | Status: AC
  Filled 2017-09-28: qty 1

## 2017-09-28 MED ORDER — SUGAMMADEX SODIUM 200 MG/2ML IV SOLN
INTRAVENOUS | Status: DC | PRN
  Administered 2017-09-28: 200 mg via INTRAVENOUS

## 2017-09-28 MED ORDER — ONDANSETRON HCL 4 MG/2ML IJ SOLN
INTRAMUSCULAR | Status: DC | PRN
  Administered 2017-09-28: 4 mg via INTRAVENOUS

## 2017-09-28 MED ORDER — FENTANYL CITRATE (PF) 100 MCG/2ML IJ SOLN: 25.0000 ug | INTRAMUSCULAR | Status: DC | PRN

## 2017-09-28 MED ORDER — DEXAMETHASONE SODIUM PHOSPHATE 10 MG/ML IJ SOLN
INTRAMUSCULAR | Status: DC | PRN
  Administered 2017-09-28: 5 mg via INTRAVENOUS

## 2017-09-28 MED ORDER — DEXAMETHASONE SODIUM PHOSPHATE 10 MG/ML IJ SOLN
INTRAMUSCULAR | Status: AC
  Filled 2017-09-28: qty 1

## 2017-09-28 MED ORDER — OXYCODONE HCL 5 MG PO TABS: 5.0000 mg | ORAL_TABLET | Freq: Once | ORAL | Status: DC | PRN

## 2017-09-28 MED ORDER — ONDANSETRON HCL 4 MG/2ML IJ SOLN
INTRAMUSCULAR | Status: AC
  Filled 2017-09-28: qty 2

## 2017-09-28 MED ORDER — ROCURONIUM BROMIDE 50 MG/5ML IV SOLN
INTRAVENOUS | Status: AC
  Filled 2017-09-28: qty 1

## 2017-09-28 MED ORDER — PROPOFOL 10 MG/ML IV BOLUS
INTRAVENOUS | Status: DC | PRN
  Administered 2017-09-28: 200 mg via INTRAVENOUS

## 2017-09-28 MED ORDER — SODIUM CHLORIDE FLUSH 0.9 % IV SOLN
INTRAVENOUS | Status: AC
  Filled 2017-09-28: qty 10

## 2017-09-28 MED ORDER — SUCCINYLCHOLINE CHLORIDE 20 MG/ML IJ SOLN
INTRAMUSCULAR | Status: AC
  Filled 2017-09-28: qty 1

## 2017-09-28 MED ORDER — PROPOFOL 10 MG/ML IV BOLUS
INTRAVENOUS | Status: AC
  Filled 2017-09-28: qty 20

## 2017-09-28 MED ORDER — FAMOTIDINE 20 MG PO TABS
20.0000 mg | ORAL_TABLET | Freq: Once | ORAL | Status: AC
  Administered 2017-09-28: 20 mg via ORAL

## 2017-09-28 MED ORDER — LIDOCAINE HCL (PF) 1 % IJ SOLN
INTRAMUSCULAR | Status: AC
  Filled 2017-09-28: qty 5

## 2017-09-28 MED ORDER — LACTATED RINGERS IV SOLN
INTRAVENOUS | Status: DC
  Administered 2017-09-28 (×2): via INTRAVENOUS

## 2017-09-28 MED ORDER — ROCURONIUM BROMIDE 100 MG/10ML IV SOLN
INTRAVENOUS | Status: DC | PRN
  Administered 2017-09-28: 5 mg via INTRAVENOUS
  Administered 2017-09-28: 10 mg via INTRAVENOUS
  Administered 2017-09-28: 15 mg via INTRAVENOUS

## 2017-09-28 MED ORDER — MIDAZOLAM HCL 5 MG/5ML IJ SOLN
INTRAMUSCULAR | Status: DC | PRN
  Administered 2017-09-28: 2 mg via INTRAVENOUS

## 2017-09-28 MED ORDER — MEPERIDINE HCL 50 MG/ML IJ SOLN: 6.2500 mg | INTRAMUSCULAR | Status: DC | PRN

## 2017-09-28 MED ORDER — SUGAMMADEX SODIUM 200 MG/2ML IV SOLN
INTRAVENOUS | Status: AC
  Filled 2017-09-28: qty 2

## 2017-09-28 MED ORDER — SEVOFLURANE IN SOLN
RESPIRATORY_TRACT | Status: AC
  Filled 2017-09-28: qty 250

## 2017-09-28 MED ORDER — FENTANYL CITRATE (PF) 100 MCG/2ML IJ SOLN
INTRAMUSCULAR | Status: DC | PRN
  Administered 2017-09-28 (×2): 50 ug via INTRAVENOUS

## 2017-09-28 MED ORDER — OXYCODONE HCL 5 MG/5ML PO SOLN: 5.0000 mg | Freq: Once | ORAL | Status: DC | PRN

## 2017-09-28 NOTE — Op Note (Signed)
Electromagnetic Navigation Bronchoscopy: Indication: lung mass/nodule  Preoperative Diagnosis:lung nodule/mass Post Procedure Diagnosis:lung nodule/mass Consent: Verbal/Written  The Risks and Benefits of the procedure explained to patient/family prior to start of procedure and I have discussed the risk for acute bleeding, increased chance of infection, increased chance of respiratory failure and cardiac arrest and death.  I have also explained to avoid all types of NSAIDs to decrease chance of bleeding, and to avoid food and drinks the midnight prior to procedure.  The procedure consists of a video camera with a light source to be placed and inserted  into the lungs to  look for abnormal tissue and to obtain tissue samples by using needle and biopsy tools.  The patient/family understand the risks and benefits and have agreed to proceed with procedure.   Hand washing performed prior to starting the procedure.   Type of Anesthesia: see Anesthesiology records .   Procedure Performed:  Virtual Bronchoscopy with Multi-planar Image analysis, 3-D reconstruction of coronal, sagittal and multi-planar images for the purposes of planning real-time bronchoscopy using the iLogic Electromagnetic Navigation Bronchoscopy System (superDimension).  Description of Procedure: After obtaining informed consent from the patient, the above sedative and anesthetic measures were carried out, flexible fiberoptic bronchoscope was inserted via Endotracheal tube after patient was intubated by CNA/Anesthesiologist.   The virtual camera was then placed into the central portion of the trachea. The trachea itself was inspected.  The main carina, right and left midstem bronchus and all the segmental and subsegmental airways by virtual bronchoscopy were inspected. The camera was directed to standard registration points at the following centers: main carina, right upper lobe bronchus, right lower lobe bronchus, right middle  lobe bronchus, left upper lobe bronchus, and the left lower lobe bronchus. This data was transferred to the i-Logic ENB system for real-time bronchoscopy.   The scope was then navigated to the RML  for tissue sampling  Specimans Obtained:  Transbronchial Fine Needle Aspirations 21G times:8  Transbronchial Forceps Biopsy times:11  Transbronchial Triple Needle Brush:3   Fluoroscopy:  Fluoroscopy was utilized during the course of this procedure to assure that biopsies were taken in a safe manner under fluoroscopic guidance with spot films required.   Complications:None  Estimated Blood Loss: minimal approx 1cc  Monitoring:  The patient was monitored with continuous oximetry and received supplemental nasal cannula oxygen throughout the procedure. In addition, serial blood pressure measurements and continuous electrocardiography showed these physiologic parameters to remain tolerable throughout the procedure.   Assessment and Plan/Additional Comments: Follow up Pathology Reports    Steven Conrad, M.D.  [facility]  Medical Director [facility] Medical Director [facility]

## 2017-09-28 NOTE — Anesthesia Post-op Follow-up Note (Signed)
Anesthesia QCDR form completed.        

## 2017-09-28 NOTE — Anesthesia Postprocedure Evaluation (Signed)
Anesthesia Post Note  Patient: Steven PlattJoseph T Conrad  Procedure(s) Performed: ENDOBRONCHIAL ULTRASOUND (N/A )  Patient location during evaluation: PACU Anesthesia Type: General Level of consciousness: awake and alert and oriented Pain management: pain level controlled Vital Signs Assessment: post-procedure vital signs reviewed and stable Respiratory status: spontaneous breathing, nonlabored ventilation and respiratory function stable Cardiovascular status: blood pressure returned to baseline and stable Postop Assessment: no signs of nausea or vomiting Anesthetic complications: no     Last Vitals:  Vitals:   09/28/17 1447 09/28/17 1501  BP:  129/86  Pulse: 65 70  Resp: (!) 21 16  Temp: (!) 36.4 C   SpO2: 99% 100%    Last Pain:  Vitals:   09/28/17 1501  TempSrc:   PainSc: 2                  Travus Oren

## 2017-09-28 NOTE — Anesthesia Procedure Notes (Signed)
Procedure Name: Intubation Date/Time: 09/28/2017 1:10 PM Performed by: Dionne Bucy, CRNA Pre-anesthesia Checklist: Patient identified, Patient being monitored, Timeout performed, Emergency Drugs available and Suction available Patient Re-evaluated:Patient Re-evaluated prior to induction Oxygen Delivery Method: Circle system utilized Preoxygenation: Pre-oxygenation with 100% oxygen Induction Type: IV induction Ventilation: Mask ventilation without difficulty Laryngoscope Size: Mac and 4 Grade View: Grade I Tube type: Oral Tube size: 8.5 mm Number of attempts: 1 Airway Equipment and Method: Stylet Placement Confirmation: ETT inserted through vocal cords under direct vision,  positive ETCO2 and breath sounds checked- equal and bilateral Secured at: 23 cm Tube secured with: Tape Dental Injury: Teeth and Oropharynx as per pre-operative assessment

## 2017-09-28 NOTE — Discharge Instructions (Signed)

## 2017-09-28 NOTE — Transfer of Care (Signed)
Immediate Anesthesia Transfer of Care Note  Patient: Leane PlattJoseph T Doi  Procedure(s) Performed: ENDOBRONCHIAL ULTRASOUND (N/A )  Patient Location: PACU  Anesthesia Type:General  Level of Consciousness: sedated  Airway & Oxygen Therapy: Patient Spontanous Breathing and Patient connected to face mask oxygen  Post-op Assessment: Report given to RN and Post -op Vital signs reviewed and stable  Post vital signs: Reviewed and stable  Last Vitals:  Vitals:   09/28/17 1202 09/28/17 1428  BP: 132/89 (P) 129/77  Pulse: 63 (P) 69  Resp: 17 (P) 20  Temp: 36.6 C (!) (P) 35.9 C  SpO2: 100% (P) 100%    Last Pain:  Vitals:   09/28/17 1202  TempSrc: Oral         Complications: No apparent anesthesia complications

## 2017-09-28 NOTE — OR Nursing (Signed)
Discharge instructions discussed with pt and family. Dr Belia HemanKasa office to call pt with an oncology appt. Family voices understanding.

## 2017-09-28 NOTE — Interval H&P Note (Signed)
History and Physical Interval Note:  09/28/2017 1:02 PM  Steven Conrad  has presented today for surgery, with the diagnosis of LUNG MASS  The various methods of treatment have been discussed with the patient and family. After consideration of risks, benefits and other options for treatment, the patient has consented to  Procedure(s): ENDOBRONCHIAL ULTRASOUND (N/A) ENB as a surgical intervention .  The patient's history has been reviewed, patient examined, no change in status, stable for surgery.  I have reviewed the patient's chart and labs.  Questions were answered to the patient's satisfaction.     Erin FullingKurian [name]

## 2017-09-28 NOTE — Anesthesia Preprocedure Evaluation (Signed)
Anesthesia Evaluation  Patient identified by MRN, date of birth, ID band Patient awake    Reviewed: Allergy & Precautions, NPO status , Patient's Chart, lab work & pertinent test results  History of Anesthesia Complications Negative for: history of anesthetic complications  Airway Mallampati: II  TM Distance: >3 FB Neck ROM: Full    Dental no notable dental hx.    Pulmonary neg sleep apnea, neg COPD, Current Smoker (states he quit smoking 2 days ago),    breath sounds clear to auscultation- rhonchi (-) wheezing      Cardiovascular Exercise Tolerance: Good (-) hypertension(-) CAD, (-) Past MI, (-) Cardiac Stents and (-) CABG  Rhythm:Regular Rate:Normal - Systolic murmurs and - Diastolic murmurs    Neuro/Psych negative neurological ROS  negative psych ROS   GI/Hepatic Neg liver ROS, GERD  ,  Endo/Other  negative endocrine ROSneg diabetes  Renal/GU negative Renal ROS     Musculoskeletal negative musculoskeletal ROS (+)   Abdominal (+) + obese,   Peds  Hematology negative hematology ROS (+)   Anesthesia Other Findings   Reproductive/Obstetrics                             Anesthesia Physical Anesthesia Plan  ASA: II  Anesthesia Plan: General   Post-op Pain Management:    Induction: Intravenous  PONV Risk Score and Plan: 0 and Ondansetron and Midazolam  Airway Management Planned: Oral ETT  Additional Equipment:   Intra-op Plan:   Post-operative Plan: Extubation in OR  Informed Consent: I have reviewed the patients History and Physical, chart, labs and discussed the procedure including the risks, benefits and alternatives for the proposed anesthesia with the patient or authorized representative who has indicated his/her understanding and acceptance.   Dental advisory given  Plan Discussed with: CRNA and Anesthesiologist  Anesthesia Plan Comments:         Anesthesia Quick  Evaluation

## 2017-09-29 LAB — CYTOLOGY - NON PAP

## 2017-09-29 LAB — SURGICAL PATHOLOGY

## 2017-10-02 ENCOUNTER — Other Ambulatory Visit: Payer: Self-pay | Admitting: *Deleted

## 2017-10-02 ENCOUNTER — Inpatient Hospital Stay: Payer: BLUE CROSS/BLUE SHIELD | Attending: Oncology | Admitting: Oncology

## 2017-10-02 ENCOUNTER — Encounter: Payer: Self-pay | Admitting: Oncology

## 2017-10-02 ENCOUNTER — Encounter: Payer: Self-pay | Admitting: *Deleted

## 2017-10-02 ENCOUNTER — Inpatient Hospital Stay: Payer: BLUE CROSS/BLUE SHIELD

## 2017-10-02 ENCOUNTER — Other Ambulatory Visit: Payer: Self-pay

## 2017-10-02 VITALS — BP 166/119 | HR 60 | Temp 97.6°F | Resp 12 | Ht 70.0 in | Wt 232.0 lb

## 2017-10-02 DIAGNOSIS — R030 Elevated blood-pressure reading, without diagnosis of hypertension: Secondary | ICD-10-CM | POA: Diagnosis not present

## 2017-10-02 DIAGNOSIS — R918 Other nonspecific abnormal finding of lung field: Secondary | ICD-10-CM | POA: Diagnosis not present

## 2017-10-02 DIAGNOSIS — R042 Hemoptysis: Secondary | ICD-10-CM | POA: Insufficient documentation

## 2017-10-02 DIAGNOSIS — Z87891 Personal history of nicotine dependence: Secondary | ICD-10-CM | POA: Insufficient documentation

## 2017-10-02 DIAGNOSIS — I10 Essential (primary) hypertension: Secondary | ICD-10-CM

## 2017-10-02 NOTE — Progress Notes (Signed)
Patient here for initial visit. No complaints today. 

## 2017-10-02 NOTE — Progress Notes (Addendum)
Hematology/Oncology Consult note [facility] [phone] [phone]   Patient Care Team: Rankins, Bill Salinas, MD as PCP - General (Family Medicine) Telford Nab, RN as Registered Nurse  REFERRING PROVIDER: Dr. Mortimer Fries pulmonology CHIEF COMPLAINTS/PURPOSE OF CONSULTATION:  Evaluation of lung mass  HISTORY OF PRESENTING ILLNESS:  Steven Conrad is a  37 y.o.  male with PMH listed below who was referred to me for evaluation of lung mass Patient has a history of 18-pack-year smoking history, who recently presented emergency room for evaluation of productive cough/hemoptysis.  Patient had a history of pneumonia about 6 months ago and had a chest x-ray in [place].  2 days prior to presentation in emergency room he started to have coughing up bright colored blood clots.  Patient had chest x-ray which showed an right lobe opacity.  CT scan showed CT chest scan:  1. Spiculated pulmonary nodule in the right middle lobe measures 2.5 x 2.3 x 1.8 cm. Although the patient is only 37 years old, with a significant smoking history, this is most concerning for a primary lung neoplasm. Cavitary infection is considered less likely.Recommend PET scan for further evaluation of malignancy. Patient was seen and evaluated by pulmonology Dr. Mortimer Fries.  Patient underwent bronchoscopy and right middle lobe ENB guided biopsy. Patient is referred to me to discuss about pathology results and future management plans. Patient is accompanied by his wife to the clinic today, appears anxious.  Blood pressure in the clinic is very high denies any history of hypertension. Denies headache.  Review of Systems  Constitutional: Negative for chills, diaphoresis, fever, malaise/fatigue and weight loss.  HENT: Negative for hearing loss and nosebleeds.   Eyes: Negative for blurred vision and photophobia.  Respiratory: Positive for hemoptysis. Negative for cough, sputum production and shortness of  breath.   Cardiovascular: Negative for chest pain and leg swelling.  Gastrointestinal: Negative for heartburn, nausea and vomiting.  Genitourinary: Negative for dysuria and urgency.  Musculoskeletal: Negative for myalgias and neck pain.  Skin: Negative for rash.  Neurological: Negative for dizziness, tingling, sensory change and speech change.  Endo/Heme/Allergies: Does not bruise/bleed easily.  Psychiatric/Behavioral: Negative for depression and substance abuse.    MEDICAL HISTORY:  Past Medical History:  Diagnosis Date  . Bloody sputum   . Psoriasis     SURGICAL HISTORY: Past Surgical History:  Procedure Laterality Date  . ENDOBRONCHIAL ULTRASOUND N/A 09/28/2017   Procedure: ENDOBRONCHIAL ULTRASOUND;  Surgeon: Flora Lipps, MD;  Location: [facility] ORS;  Service: Cardiopulmonary;  Laterality: N/A;  . ESOPHAGOGASTRODUODENOSCOPY  07/26/2011   Procedure: ESOPHAGOGASTRODUODENOSCOPY (EGD);  Surgeon: Dorothyann Peng, MD;  Location: [facility];  Service: Endoscopy;  Laterality: N/A;  . NO PAST SURGERIES    . SAVORY DILATION  07/28/2011   Procedure: SAVORY DILATION;  Surgeon: Dorothyann Peng, MD;  Location: [facility];  Service: Endoscopy;  Laterality: N/A;  Dilated with 12.8, 14, 15, and 16    SOCIAL HISTORY: Social History   Socioeconomic History  . Marital status: Married    Spouse name: Not on file  . Number of children: Not on file  . Years of education: Not on file  . Highest education level: Not on file  Social Needs  . Financial resource strain: Not on file  . Food insecurity - worry: Not on file  . Food insecurity - inability: Not on file  . Transportation needs - medical: Not on file  . Transportation needs - non-medical: Not on file  Occupational History  .  Not on file  Tobacco Use  . Smoking status: Former Smoker    Packs/day: 0.50    Years: 18.00    Pack years: 9.00    Types: Cigarettes    Last attempt to quit: 09/27/2017    Years since quitting: 0.0  . Smokeless  tobacco: Never Used  Substance and Sexual Activity  . Alcohol use: Yes    Comment: 1/5 liquor on weekends  . Drug use: No  . Sexual activity: Not on file  Other Topics Concern  . Not on file  Social History Narrative  . Not on file    FAMILY HISTORY: Family History  Problem Relation Age of Onset  . Thyroid cancer Mother   . Sleep apnea Mother   . Thyroid disease Mother   . Anesthesia problems Neg Hx   . Hypotension Neg Hx   . Malignant hyperthermia Neg Hx   . Pseudochol deficiency Neg Hx     ALLERGIES:  has No Known Allergies.  MEDICATIONS:  Current Outpatient Medications  Medication Sig Dispense Refill  . esomeprazole (NEXIUM) 40 MG capsule 1 po 30 minutes prior to breakfast (Patient not taking: Reported on 09/27/2017) 30 capsule 11   No current facility-administered medications for this visit.      PHYSICAL EXAMINATION: ECOG PERFORMANCE STATUS: 1 - Symptomatic but completely ambulatory Vitals:   10/02/17 1320 10/02/17 1326  BP:  (!) 166/119  Pulse:  60  Resp: 12   Temp:  97.6 F (36.4 C)   Filed Weights   10/02/17 1320  Weight: 232 lb (105.2 kg)    Physical Exam  Constitutional: He is oriented to person, place, and time and well-developed, well-nourished, and in no distress. No distress.  HENT:  Head: Normocephalic and atraumatic.  Mouth/Throat: No oropharyngeal exudate.  Eyes: Conjunctivae and EOM are normal. Pupils are equal, round, and reactive to light. No scleral icterus.  Neck: Normal range of motion. Neck supple.  Cardiovascular: Normal rate, regular rhythm and normal heart sounds.  No murmur heard. Pulmonary/Chest: Breath sounds normal. No respiratory distress.  Abdominal: Soft. Bowel sounds are normal. He exhibits no distension.  Musculoskeletal: Normal range of motion. He exhibits no edema.  Lymphadenopathy:    He has no cervical adenopathy.  Neurological: He is alert and oriented to person, place, and time. No cranial nerve deficit.  Skin:  Skin is warm and dry.  Psychiatric: Affect and judgment normal.     LABORATORY DATA:  I have reviewed the data as listed Lab Results  Component Value Date   WBC 5.8 09/26/2017   HGB 15.0 09/26/2017   HCT 43.5 09/26/2017   MCV 95.2 09/26/2017   PLT 177 09/26/2017   Recent Labs    09/26/17 0015  NA 138  K 4.1  CL 103  CO2 22  GLUCOSE 104*  BUN 13  CREATININE 0.98  CALCIUM 9.8  GFRNONAA >60  GFRAA >60  PROT 7.2  ALBUMIN 4.5  AST 27  ALT 36  ALKPHOS 72  BILITOT 0.6    Pathology:  Surgical Pathology  CASE: [id number] DIAGNOSIS:  A. LUNG, RIGHT MIDDLE LOBE; ENB-GUIDED BIOPSY:  - NEGATIVE FOR MALIGNANCY.  - LUNG PARENCHYMA WITH CHRONIC INFLAMMATION.    ASSESSMENT & PLAN:  1. Lung mass   2. Hemoptysis   3. Hypertension, unspecified type    Pathology results were discussed with patient.  Showed negative for malignancy.  However the appearance of lung mass is quite concerning.  Explained to patient that a negative biopsy but  cannot completely rule out malignancy.  False-negative is a possibility, other differential including infection, tuberculosi.s, etc.. I will obtain a PET scan to evaluate the metabolic activity. Obtain a QuantiFERON test. HTN is likely due to anxiety. Continue monitor.  Present his case on tumor board. Patient has met oncology  navigator Shawn. All questions were answered. The patient knows to call the clinic with any problems questions or concerns.  Return of visit: after PET scan.  Thank you for this kind referral and the opportunity to participate in the care of this patient. A copy of today's note is routed to referring provider    Earlie Server, MD, PhD Hematology Oncology [facility] Pager- [phone] 10/02/2017

## 2017-10-02 NOTE — Progress Notes (Signed)
Met with patient and spouse at medical oncology appointment. Introduced Programmer, multimedia and have Psychologist, counselling for myself and [place]. Reviewed plan of care as well as recent CT imaging. Provided support and assisted in coordination of PET scan, discussion at multidisciplinary thoracic conference, and follow up appt with Dr. Tasia Catchings.

## 2017-10-03 ENCOUNTER — Other Ambulatory Visit: Payer: Self-pay | Admitting: Internal Medicine

## 2017-10-03 ENCOUNTER — Telehealth: Payer: Self-pay | Admitting: Internal Medicine

## 2017-10-03 DIAGNOSIS — R918 Other nonspecific abnormal finding of lung field: Secondary | ICD-10-CM

## 2017-10-03 NOTE — Telephone Encounter (Signed)
None-no labs needed

## 2017-10-03 NOTE — Progress Notes (Signed)
I discussed Biopsy results with patient. I have recommended CT guided biopsy after discussing case with IR Dr Alcide CleverMark Lukens  Patient to have PET scan on Thursday Feb 14, plan for CT guided biopsy as well    Lucie LeatherKurian David Sadat Sliwa, M.D.  [facility]  Medical Director [facility] Medical Director [facility] Cardio-P[facility] Department

## 2017-10-03 NOTE — Telephone Encounter (Signed)
Please advise on what labs you would like for the CT biopsy for this pt. Order is pended.

## 2017-10-03 NOTE — Telephone Encounter (Signed)
Order had to be changed due to the order for needle biospy can't be used. On the correct order that is pended, it ask if you want labs or "none". Please advise if any labs or none due to until order can't be completed and scheduled without that labs question answered. Thanks.

## 2017-10-03 NOTE — Telephone Encounter (Signed)
Patient calling to schedule biopsy please call

## 2017-10-03 NOTE — Telephone Encounter (Signed)
Pt is calling back to confirm bronchoscopy date

## 2017-10-03 NOTE — Telephone Encounter (Signed)
None marked on order for CT Bx. Order completed.

## 2017-10-03 NOTE — Telephone Encounter (Signed)
cytology orders will be placed by IR(radiologist)

## 2017-10-04 ENCOUNTER — Other Ambulatory Visit: Payer: Self-pay | Admitting: Internal Medicine

## 2017-10-04 DIAGNOSIS — R918 Other nonspecific abnormal finding of lung field: Secondary | ICD-10-CM

## 2017-10-04 NOTE — Telephone Encounter (Signed)
Are we still waiting for CT BX order to be signed?

## 2017-10-04 NOTE — Telephone Encounter (Signed)
On Mr. Ladona Ridgelaylor- Per Barbara-PET Scan must be done first so the radiologist will know the best area to biopsy. Once PET has been done, Radiologist will review and biopsy will be scheduled but not until after the PET.

## 2017-10-04 NOTE — Telephone Encounter (Signed)
Spoke with pt and informed him per radiology that he needs to do PET scan first before we can schedule the biopsy so that they will know to go into. Pt verbalized understanding. Nothing further needed.

## 2017-10-05 ENCOUNTER — Other Ambulatory Visit: Payer: Self-pay | Admitting: Oncology

## 2017-10-05 ENCOUNTER — Other Ambulatory Visit: Payer: Self-pay | Admitting: *Deleted

## 2017-10-05 ENCOUNTER — Ambulatory Visit
Admission: RE | Admit: 2017-10-05 | Discharge: 2017-10-05 | Disposition: A | Discharge status: Home / Self Care | Payer: BLUE CROSS/BLUE SHIELD | Source: Ambulatory Visit | Attending: Oncology | Admitting: Oncology

## 2017-10-05 DIAGNOSIS — I700 Atherosclerosis of aorta: Secondary | ICD-10-CM | POA: Insufficient documentation

## 2017-10-05 DIAGNOSIS — J90 Pleural effusion, not elsewhere classified: Secondary | ICD-10-CM | POA: Insufficient documentation

## 2017-10-05 DIAGNOSIS — R918 Other nonspecific abnormal finding of lung field: Secondary | ICD-10-CM | POA: Diagnosis not present

## 2017-10-05 DIAGNOSIS — R911 Solitary pulmonary nodule: Secondary | ICD-10-CM | POA: Diagnosis not present

## 2017-10-05 LAB — MISC LABCORP TEST (SEND OUT): LABCORP TEST CODE: 182879

## 2017-10-05 LAB — GLUCOSE, CAPILLARY: GLUCOSE-CAPILLARY: 82 mg/dL (ref 65–99)

## 2017-10-05 MED ORDER — FLUDEOXYGLUCOSE F - 18 (FDG) INJECTION
11.9700 | Freq: Once | INTRAVENOUS | Status: AC | PRN
  Administered 2017-10-05: 11.97 via INTRAVENOUS

## 2017-10-05 MED ORDER — AMOXICILLIN-POT CLAVULANATE 875-125 MG PO TABS: 1.0000 | ORAL_TABLET | Freq: Two times a day (BID) | ORAL | 0 refills | Status: AC

## 2017-10-06 ENCOUNTER — Ambulatory Visit
Admission: RE | Admit: 2017-10-06 | Discharge: 2017-10-06 | Disposition: A | Discharge status: Home / Self Care | Payer: Self-pay | Source: Ambulatory Visit | Attending: Internal Medicine | Admitting: Internal Medicine

## 2017-10-06 ENCOUNTER — Institutional Professional Consult (permissible substitution): Payer: BLUE CROSS/BLUE SHIELD | Admitting: Internal Medicine

## 2017-10-06 ENCOUNTER — Telehealth: Payer: Self-pay | Admitting: *Deleted

## 2017-10-06 ENCOUNTER — Ambulatory Visit: Payer: BLUE CROSS/BLUE SHIELD | Admitting: Internal Medicine

## 2017-10-06 ENCOUNTER — Other Ambulatory Visit: Payer: Self-pay | Admitting: *Deleted

## 2017-10-06 ENCOUNTER — Other Ambulatory Visit: Payer: Self-pay | Admitting: Internal Medicine

## 2017-10-06 DIAGNOSIS — R918 Other nonspecific abnormal finding of lung field: Secondary | ICD-10-CM

## 2017-10-06 DIAGNOSIS — R911 Solitary pulmonary nodule: Secondary | ICD-10-CM

## 2017-10-06 DIAGNOSIS — R042 Hemoptysis: Secondary | ICD-10-CM

## 2017-10-06 NOTE — Telephone Encounter (Signed)
Dr. Belia HemanKasa has been made aware.

## 2017-10-06 NOTE — Telephone Encounter (Signed)
Both Dr. Ardyth Manam and Dr. Belia HemanKasa made aware.

## 2017-10-06 NOTE — Telephone Encounter (Signed)
-----   Message from Jonne PlyShawn P Perkins, RN sent at 10/05/2017  3:23 PM EST ----- Regarding: patient on schedule for tomorrow Hey yall, I'm so sorry but after talking with the patient, who lives out of town, he doesn't want to come in tomorrow. I had our docs order Augmentin 875 po bid x 10 days. I'll cancel the pulm. appt but please let me know if Dr. Nicholos Johnsamachandran or Belia HemanKasa would recommend any other antibiotic.  Also, can I get the disc that the [facility] from 8 months ago? I think he said he gave it to Dr. Belia HemanKasa.   Thanks.

## 2017-10-06 NOTE — Progress Notes (Signed)
Case reviewed in multidisciplinary thoracic conference. Recommendation given for treatment with antibiotics and 1 month follow up CT scan. Contacted patient with review of PET scan results and recommendations. Coordinated antibiotics, planned xray, and CT chest orders. Will follow.

## 2017-10-06 NOTE — Telephone Encounter (Signed)
-----   Message from Jonne PlyShawn P Perkins, RN sent at 10/05/2017  1:35 PM EST ----- Regarding: appts needed We presented him in conference, he needs antibiotics and a repeat CT scan in 1 month. Dr. Thelma Compamachandron said he should be seen tomorrow if possible. Not sure if he or Dr. Belia HemanKasa is available. I'm happy to call him with the appt.

## 2017-10-09 ENCOUNTER — Ambulatory Visit: Payer: BLUE CROSS/BLUE SHIELD | Admitting: Oncology

## 2017-10-16 ENCOUNTER — Ambulatory Visit: Payer: BLUE CROSS/BLUE SHIELD

## 2017-10-18 ENCOUNTER — Other Ambulatory Visit: Payer: BLUE CROSS/BLUE SHIELD

## 2017-10-24 ENCOUNTER — Ambulatory Visit
Admission: RE | Admit: 2017-10-24 | Discharge: 2017-10-24 | Disposition: A | Discharge status: Home / Self Care | Payer: BLUE CROSS/BLUE SHIELD | Source: Ambulatory Visit | Attending: Oncology | Admitting: Oncology

## 2017-10-24 DIAGNOSIS — R911 Solitary pulmonary nodule: Secondary | ICD-10-CM | POA: Diagnosis not present

## 2017-10-24 DIAGNOSIS — R042 Hemoptysis: Secondary | ICD-10-CM | POA: Diagnosis not present

## 2017-10-24 DIAGNOSIS — R918 Other nonspecific abnormal finding of lung field: Secondary | ICD-10-CM | POA: Diagnosis not present

## 2017-10-27 ENCOUNTER — Ambulatory Visit: Payer: PRIVATE HEALTH INSURANCE | Admitting: Internal Medicine

## 2017-11-02 ENCOUNTER — Telehealth: Payer: Self-pay | Admitting: *Deleted

## 2017-11-02 NOTE — Telephone Encounter (Signed)
Results of xray post antibiotic therapy reviewed with Dr. Grace IsaacWatts in radiology. Recommendation is for follow up noncontrast CT the 1st week in April. Patient reports resolution of all symptoms and is in agreement with plan. Will reschedule CT when patient provides scheduling preferences.

## 2017-11-02 NOTE — Telephone Encounter (Signed)
Thank you for the update!

## 2017-11-03 ENCOUNTER — Ambulatory Visit: Admission: RE | Admit: 2017-11-03 | Payer: BLUE CROSS/BLUE SHIELD | Source: Ambulatory Visit

## 2017-11-30 ENCOUNTER — Telehealth: Payer: Self-pay | Admitting: *Deleted

## 2017-11-30 NOTE — Telephone Encounter (Signed)
Patient has cancelled prior appointments for recommended follow up chest CT imaging. Multiple attempts have been made to contact patient regarding the recommendation to verify that his lung abnormality has resolved as the chest  x ray would lead us to believe. However, patient has not returned my call. He does have my contact information if he desires to call back.

## 2018-06-20 DIAGNOSIS — Z3009 Encounter for other general counseling and advice on contraception: Secondary | ICD-10-CM | POA: Diagnosis not present

## 2018-10-30 DIAGNOSIS — Z0000 Encounter for general adult medical examination without abnormal findings: Secondary | ICD-10-CM | POA: Diagnosis not present

## 2018-11-16 ENCOUNTER — Telehealth: Payer: Self-pay

## 2018-11-16 NOTE — Telephone Encounter (Signed)
Dr. Luciana Axe at [facility] called regarding patient. She is concerned as the patient has not had any follow-up since the bronch. Looking at the note from [facility], it looks like we have tried to set up him up with f/u CT but he has not returned his calls.   Dr. Belia Heman please advise on where we need to go from here.  Dr. Luciana Axe was VERY concerned that he was not followed up on.

## 2018-11-16 NOTE — Telephone Encounter (Signed)
Please call patient and set up follow up We can not do anything else if patient does NOT respond

## 2018-11-16 NOTE — Telephone Encounter (Signed)
Called and spoke to pt.  Pt did not wish to schedule an appt at this time, due to last office visit being very expensive and pt stating that he was "misdiagnosed".   I have left a message with Angelica Chessman at [facility], requesting that she make Dr. Barbaraann Barthel aware of this.  Nothing further is needed at this time.

## 2019-10-04 DIAGNOSIS — Z3009 Encounter for other general counseling and advice on contraception: Secondary | ICD-10-CM | POA: Diagnosis not present

## 2019-10-30 DIAGNOSIS — Z302 Encounter for sterilization: Secondary | ICD-10-CM | POA: Diagnosis not present

## 2019-11-21 DIAGNOSIS — Z13220 Encounter for screening for lipoid disorders: Secondary | ICD-10-CM | POA: Diagnosis not present

## 2019-11-21 DIAGNOSIS — R718 Other abnormality of red blood cells: Secondary | ICD-10-CM | POA: Diagnosis not present

## 2019-11-21 DIAGNOSIS — Z0000 Encounter for general adult medical examination without abnormal findings: Secondary | ICD-10-CM | POA: Diagnosis not present

## 2019-11-21 DIAGNOSIS — Z7289 Other problems related to lifestyle: Secondary | ICD-10-CM | POA: Diagnosis not present

## 2019-11-21 DIAGNOSIS — Z23 Encounter for immunization: Secondary | ICD-10-CM | POA: Diagnosis not present

## 2020-01-26 IMAGING — CT NM PET TUM IMG INITIAL (PI) SKULL BASE T - THIGH
1 of 9 series · 1 of 25 positions shown · non-contrast
Comparison: Chest CT 09/26/2017.

CLINICAL DATA: Initial treatment strategy for right middle lobe
lung nodule. Recent hemoptysis..

EXAM:
NUCLEAR MEDICINE PET SKULL BASE TO THIGH
TECHNIQUE: 12.0 mCi F-18 FDG was injected intravenously. Full-ring PET imaging
was performed from the skull base to thigh after the radiotracer. CT
data was obtained and used for attenuation correction and anatomic
localization.
FASTING BLOOD GLUCOSE:  Value: 82 mg/dl

[Series 3: ct wb 5.0 b30f · axial · 5.0mm · 0.98mm/px · 1 of 329 slices shown]
[im 329/329  brain]
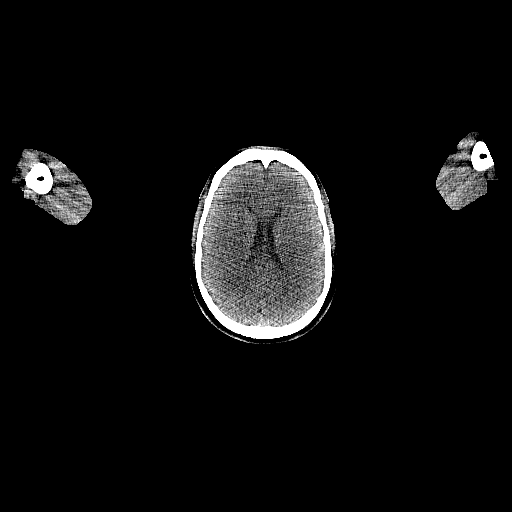

[1 of 25 positions shown; findings below may reference images not displayed]

FINDINGS: NECK: No areas of abnormal hypermetabolism. No cervical adenopathy.
Probable sebaceous cyst about the posterior right neck at 1.4 cm.

CHEST: Right middle lobe opacity is hypermetabolic. This may have
enlarged, measuring 4.3 x 2.8 cm today versus 4.0 x 2.4 cm on the
prior. Of note, of note, direct comparison is complicated by
differences in slice thickness. Measures a S.U.V. max of 5.2.

No thoracic nodal hypermetabolism. Trace right pleural fluid is new.

ABDOMEN/PELVIS: No abdominopelvic parenchymal or nodal
hypermetabolism. Normal adrenal glands. Abdominal aortic
atherosclerosis.

SKELETON: No abnormal marrow activity. No focal osseous lesion.
IMPRESSION: 1. Hypermetabolic right middle lobe process is indeterminate. This
may have enlarged since the prior CT of 9 days ago. Therefore,
infection is slightly favored. Correlate with infectious symptoms.
Consider antibiotic therapy and direct correlation with repeat CT at
3-4 weeks.
2. New tiny right pleural effusion.
3.  Aortic Atherosclerosis ([id number]).  This is age advanced.
